# Patient Record
Sex: Female | Born: 1990 | Race: Black or African American | Hispanic: Yes | Marital: Single | Smoking: Current every day smoker
Health system: Southern US, Community
[De-identification: ages and names within clinical notes are randomized; demographics above are authoritative.]

## PROBLEM LIST (undated history)

## (undated) ENCOUNTER — Inpatient Hospital Stay (HOSPITAL_COMMUNITY): Payer: Self-pay

## (undated) DIAGNOSIS — G8929 Other chronic pain: Secondary | ICD-10-CM

## (undated) DIAGNOSIS — B9689 Other specified bacterial agents as the cause of diseases classified elsewhere: Secondary | ICD-10-CM

## (undated) DIAGNOSIS — N760 Acute vaginitis: Secondary | ICD-10-CM

## (undated) DIAGNOSIS — F419 Anxiety disorder, unspecified: Secondary | ICD-10-CM

## (undated) DIAGNOSIS — A599 Trichomoniasis, unspecified: Secondary | ICD-10-CM

## (undated) DIAGNOSIS — M549 Dorsalgia, unspecified: Secondary | ICD-10-CM

## (undated) DIAGNOSIS — G43909 Migraine, unspecified, not intractable, without status migrainosus: Secondary | ICD-10-CM

## (undated) DIAGNOSIS — N883 Incompetence of cervix uteri: Secondary | ICD-10-CM

## (undated) HISTORY — DX: Anxiety disorder, unspecified: F41.9

## (undated) HISTORY — DX: Migraine, unspecified, not intractable, without status migrainosus: G43.909

## (undated) HISTORY — PX: NO PAST SURGERIES: SHX2092

---

## 2011-03-21 ENCOUNTER — Emergency Department (HOSPITAL_COMMUNITY)
Admission: EM | Admit: 2011-03-21 | Discharge: 2011-03-21 | Disposition: A | Discharge status: Home / Self Care | Payer: Self-pay | Attending: Emergency Medicine | Admitting: Emergency Medicine

## 2011-03-21 ENCOUNTER — Encounter (HOSPITAL_COMMUNITY): Payer: Self-pay | Admitting: *Deleted

## 2011-03-21 DIAGNOSIS — R51 Headache: Secondary | ICD-10-CM | POA: Insufficient documentation

## 2011-03-21 DIAGNOSIS — J3489 Other specified disorders of nose and nasal sinuses: Secondary | ICD-10-CM | POA: Insufficient documentation

## 2011-03-21 DIAGNOSIS — IMO0001 Reserved for inherently not codable concepts without codable children: Secondary | ICD-10-CM | POA: Insufficient documentation

## 2011-03-21 DIAGNOSIS — R112 Nausea with vomiting, unspecified: Secondary | ICD-10-CM | POA: Insufficient documentation

## 2011-03-21 DIAGNOSIS — R1084 Generalized abdominal pain: Secondary | ICD-10-CM | POA: Insufficient documentation

## 2011-03-21 DIAGNOSIS — B349 Viral infection, unspecified: Secondary | ICD-10-CM

## 2011-03-21 DIAGNOSIS — B9789 Other viral agents as the cause of diseases classified elsewhere: Secondary | ICD-10-CM | POA: Insufficient documentation

## 2011-03-21 DIAGNOSIS — R070 Pain in throat: Secondary | ICD-10-CM | POA: Insufficient documentation

## 2011-03-21 LAB — URINE MICROSCOPIC-ADD ON

## 2011-03-21 LAB — URINALYSIS, ROUTINE W REFLEX MICROSCOPIC
Bilirubin Urine: NEGATIVE
Ketones, ur: NEGATIVE mg/dL
Nitrite: NEGATIVE
Urobilinogen, UA: 1 mg/dL (ref 0.0–1.0)

## 2011-03-21 MED ORDER — IBUPROFEN 200 MG PO TABS
600.0000 mg | ORAL_TABLET | Freq: Once | ORAL | Status: AC
  Administered 2011-03-21: 600 mg via ORAL
  Filled 2011-03-21: qty 3

## 2011-03-21 MED ORDER — IBUPROFEN 600 MG PO TABS: 600.0000 mg | ORAL_TABLET | Freq: Four times a day (QID) | ORAL | Status: AC | PRN

## 2011-03-21 MED ORDER — ONDANSETRON HCL 4 MG PO TABS: 4.0000 mg | ORAL_TABLET | Freq: Four times a day (QID) | ORAL | Status: AC

## 2011-03-21 MED ORDER — ONDANSETRON 4 MG PO TBDP
4.0000 mg | ORAL_TABLET | Freq: Once | ORAL | Status: AC
  Administered 2011-03-21: 4 mg via ORAL
  Filled 2011-03-21: qty 1
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
## 2011-03-21 NOTE — ED Notes (Signed)
Patient states onset Sunday nausea and vomiting intermittent with generalized abdominal pain 10/10 achy abdominal soft non distended bowel sounds present in all fields.  Airway intact bilateral equal chest rise and fall. Denies urinary complaints.  Ax4 friend at bedside.

## 2011-03-21 NOTE — ED Notes (Signed)
Reports n/v x 2 days with abd pain and cold symptoms. Denies diarrhea. No distress noted at triage.

## 2011-03-21 NOTE — ED Provider Notes (Signed)
History     CSN: [id number]  Arrival date & time 03/21/11  1439   First MD Initiated Contact with Patient 03/21/11 1521      Chief Complaint  Patient presents with  . Emesis  . Abdominal Pain    (Consider location/radiation/quality/duration/timing/severity/associated sxs/prior treatment) Patient is a 21 y.o. female presenting with vomiting, abdominal pain, and URI. The history is provided by the patient.  Emesis  This is a new problem. Episode onset: a few times daily for last 3 days. The problem occurs 2 to 4 times per day. The problem has not changed since onset.The emesis has an appearance of stomach contents (No blood). There has been no fever. Associated symptoms include abdominal pain (mild generalized cramping), headaches, myalgias (very mild this morning but resolved now) and URI. Pertinent negatives include no chills, no cough, no diarrhea and no fever. Associated symptoms comments: Having normal bowel movements (last one yesterday).  .  Abdominal Pain The primary symptoms of the illness include abdominal pain (mild generalized cramping), nausea and vomiting. The primary symptoms of the illness do not include fever, fatigue, shortness of breath, diarrhea, dysuria, vaginal discharge or vaginal bleeding.  Symptoms associated with the illness do not include chills, constipation, urgency, hematuria, frequency or back pain.  URI The primary symptoms include headaches, sore throat, abdominal pain (mild generalized cramping), nausea, vomiting and myalgias (very mild this morning but resolved now). Primary symptoms do not include fever, fatigue, ear pain, swollen glands, cough, wheezing or rash. Episode onset: about 4 days ago. This is a new problem. The problem has not changed since onset. The headache is not associated with photophobia, neck stiffness or weakness.  The myalgias are not associated with weakness.  Associated with: No recent travel. Possible sick contacts at school.  Symptoms associated with the illness include congestion and rhinorrhea. The illness is not associated with chills, facial pain or sinus pressure. The following treatments were addressed: Acetaminophen was effective. Risk factors: None.    No past medical history on file.  No past surgical history on file.  No family history on file.  History  Substance Use Topics  . Smoking status: Not on file  . Smokeless tobacco: Not on file  . Alcohol Use: No    OB History    Grav Para Term Preterm Abortions TAB SAB Ect Mult Living                  Review of Systems  Constitutional: Negative for fever, chills, activity change, appetite change and fatigue.  HENT: Positive for congestion, sore throat and rhinorrhea. Negative for ear pain, neck pain, neck stiffness and sinus pressure.   Eyes: Negative for photophobia, redness and visual disturbance.  Respiratory: Negative for cough, shortness of breath and wheezing.   Cardiovascular: Negative for chest pain, palpitations and leg swelling.  Gastrointestinal: Positive for nausea, vomiting and abdominal pain (mild generalized cramping). Negative for diarrhea, constipation and blood in stool.  Genitourinary: Negative for dysuria, urgency, frequency, hematuria, flank pain, vaginal bleeding, vaginal discharge, vaginal pain and pelvic pain.  Musculoskeletal: Positive for myalgias (very mild this morning but resolved now). Negative for back pain.  Skin: Negative for rash and wound.  Neurological: Positive for headaches. Negative for dizziness, seizures, facial asymmetry, speech difficulty, weakness, light-headedness and numbness.  Psychiatric/Behavioral: Negative for confusion.  All other systems reviewed and are negative.    Allergies  Review of patient's allergies indicates no known allergies.  Home Medications   Current Outpatient Rx  Name Route Sig Dispense Refill  . ACETAMINOPHEN 325 MG PO TABS Oral Take 650 mg by mouth every 6 (six) hours  as needed. pain      BP 114/61  Pulse 80  Temp(Src) 98.4 F (36.9 C) (Oral)  Resp 18  SpO2 100%  LMP 01/30/2011  Physical Exam  Nursing note and vitals reviewed. Constitutional: She is oriented to person, place, and time. She appears well-developed and well-nourished.  Non-toxic appearance. No distress.  HENT:  Head: Normocephalic and atraumatic.  Nose: Rhinorrhea present.  Mouth/Throat: Oropharynx is clear and moist. Mucous membranes are not dry. No posterior oropharyngeal erythema.  Eyes: Conjunctivae and EOM are normal. Pupils are equal, round, and reactive to light. No scleral icterus.  Neck: Normal range of motion. Neck supple. No JVD present.  Cardiovascular: Normal rate, regular rhythm, normal heart sounds and intact distal pulses.   No murmur heard. Pulmonary/Chest: Effort normal and breath sounds normal. No respiratory distress. She has no wheezes. She has no rales.  Abdominal: Soft. Bowel sounds are normal. She exhibits no distension. There is no tenderness. There is no rebound and no guarding.  Musculoskeletal: Normal range of motion.  Neurological: She is alert and oriented to person, place, and time. She has normal strength and normal reflexes. No cranial nerve deficit or sensory deficit. GCS eye subscore is 4. GCS verbal subscore is 5. GCS motor subscore is 6.  Skin: Skin is warm and dry. No rash noted. She is not diaphoretic.  Psychiatric: She has a normal mood and affect.    ED Course  Procedures (including critical care time)  Labs Reviewed  URINALYSIS, ROUTINE W REFLEX MICROSCOPIC - Abnormal; Notable for the following:    APPearance HAZY (*)    Hgb urine dipstick TRACE (*)    All other components within normal limits  URINE MICROSCOPIC-ADD ON - Abnormal; Notable for the following:    Squamous Epithelial / LPF MANY (*)    Bacteria, UA FEW (*)    All other components within normal limits  POCT PREGNANCY, URINE   No results found.   1. Viral syndrome        MDM  20yo AAF with no significant PMH who presents to the ED due to nasal congestion, headache, sore throat, vomiting and mild abdominal discomfort diffusely. Onset 3-4d ago. Symptoms consistent with flu-like viral illness. Pt does not appear to be dehydrated. Abdominal exam benign without any tenderness to palpation. Pt afebrile and well appearing on exam. No missed periods. UPT neg. UA ordered by RN pt w/o urinary sx few bact on UA but also epethelials present will not tx. No CVA tenderness. Will d/c on motrin and zofran.         Verne Carrow, MD 03/21/11 [phone]

## 2011-03-21 NOTE — ED Notes (Signed)
NAD noted at d/c. Pt verbalized understanding of d/c inst. 

## 2011-03-22 NOTE — ED Provider Notes (Signed)
I saw and evaluated the patient, reviewed the resident's note and I agree with the findings and plan.  Nicholes Stairs, MD 03/22/11 [phone]

## 2012-03-27 ENCOUNTER — Encounter (HOSPITAL_COMMUNITY): Payer: Self-pay | Admitting: *Deleted

## 2012-03-27 ENCOUNTER — Emergency Department (HOSPITAL_COMMUNITY)
Admission: EM | Admit: 2012-03-27 | Discharge: 2012-03-27 | Disposition: A | Discharge status: Home / Self Care | Payer: Self-pay | Attending: Emergency Medicine | Admitting: Emergency Medicine

## 2012-03-27 DIAGNOSIS — S20219A Contusion of unspecified front wall of thorax, initial encounter: Secondary | ICD-10-CM | POA: Insufficient documentation

## 2012-03-27 DIAGNOSIS — R58 Hemorrhage, not elsewhere classified: Secondary | ICD-10-CM

## 2012-03-27 DIAGNOSIS — Y929 Unspecified place or not applicable: Secondary | ICD-10-CM | POA: Insufficient documentation

## 2012-03-27 DIAGNOSIS — Y939 Activity, unspecified: Secondary | ICD-10-CM | POA: Insufficient documentation

## 2012-03-27 DIAGNOSIS — X58XXXA Exposure to other specified factors, initial encounter: Secondary | ICD-10-CM | POA: Insufficient documentation

## 2012-03-27 DIAGNOSIS — M549 Dorsalgia, unspecified: Secondary | ICD-10-CM | POA: Insufficient documentation

## 2012-03-27 DIAGNOSIS — G8929 Other chronic pain: Secondary | ICD-10-CM | POA: Insufficient documentation

## 2012-03-27 HISTORY — DX: Other chronic pain: G89.29

## 2012-03-27 HISTORY — DX: Dorsalgia, unspecified: M54.9

## 2012-03-27 LAB — BASIC METABOLIC PANEL
CO2: 25 mEq/L (ref 19–32)
Chloride: 104 mEq/L (ref 96–112)
Creatinine, Ser: 0.66 mg/dL (ref 0.50–1.10)
Potassium: 4.4 mEq/L (ref 3.5–5.1)

## 2012-03-27 LAB — CBC WITH DIFFERENTIAL/PLATELET
Basophils Absolute: 0 10*3/uL (ref 0.0–0.1)
HCT: 40.6 % (ref 36.0–46.0)
Hemoglobin: 13.1 g/dL (ref 12.0–15.0)
Lymphocytes Relative: 19 % (ref 12–46)
Monocytes Absolute: 0.7 10*3/uL (ref 0.1–1.0)
Neutro Abs: 8.6 10*3/uL — ABNORMAL HIGH (ref 1.7–7.7)
Neutrophils Relative %: 72 % (ref 43–77)
RDW: 14.1 % (ref 11.5–15.5)
WBC: 12 10*3/uL — ABNORMAL HIGH (ref 4.0–10.5)

## 2012-03-27 NOTE — ED Notes (Signed)
Pt noticed yellow bruising to chest last night.  When she woke up this am, they were "yellower".  Denies injury or physical contact with another.  States she often sleeps with her hands up on her chest.  Also states she frequently awakes with bruises.

## 2012-03-27 NOTE — ED Notes (Signed)
Pt has "thumb print" sized bruises on upper chest. They appear to be yellow bruises. Pt denies trauma. Pt denies tenderness.

## 2012-03-27 NOTE — ED Provider Notes (Signed)
Medical screening examination/treatment/procedure(s) were performed by non-physician practitioner and as supervising physician I was immediately available for consultation/collaboration.  Dennice Tindol R. Charan Prieto, MD 03/27/12 2345 
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
## 2013-01-19 ENCOUNTER — Encounter: Payer: Self-pay | Admitting: Obstetrics & Gynecology

## 2013-04-27 ENCOUNTER — Encounter: Payer: Self-pay | Admitting: Obstetrics & Gynecology

## 2013-10-28 ENCOUNTER — Emergency Department (HOSPITAL_COMMUNITY)
Admission: EM | Admit: 2013-10-28 | Discharge: 2013-10-28 | Disposition: A | Discharge status: Home / Self Care | Payer: Self-pay | Source: Home / Self Care

## 2013-10-28 ENCOUNTER — Encounter (HOSPITAL_COMMUNITY): Payer: Self-pay | Admitting: Emergency Medicine

## 2013-10-28 DIAGNOSIS — N12 Tubulo-interstitial nephritis, not specified as acute or chronic: Secondary | ICD-10-CM

## 2013-10-28 LAB — POCT URINALYSIS DIP (DEVICE)
Bilirubin Urine: NEGATIVE
Glucose, UA: NEGATIVE mg/dL
Ketones, ur: NEGATIVE mg/dL
NITRITE: POSITIVE — AB
PH: 6 (ref 5.0–8.0)
Specific Gravity, Urine: 1.02 (ref 1.005–1.030)
Urobilinogen, UA: 1 mg/dL (ref 0.0–1.0)

## 2013-10-28 LAB — POCT PREGNANCY, URINE: PREG TEST UR: NEGATIVE

## 2013-10-28 MED ORDER — ACETAMINOPHEN 500 MG PO TABS
1000.0000 mg | ORAL_TABLET | Freq: Once | ORAL | Status: AC
  Administered 2013-10-28: 1000 mg via ORAL

## 2013-10-28 MED ORDER — SODIUM CHLORIDE 0.9 % IV SOLN
Freq: Once | INTRAVENOUS | Status: AC
  Administered 2013-10-28: 18:00:00 via INTRAVENOUS

## 2013-10-28 MED ORDER — CEFTRIAXONE SODIUM 1 G IJ SOLR
INTRAMUSCULAR | Status: AC
  Filled 2013-10-28: qty 10

## 2013-10-28 MED ORDER — ACETAMINOPHEN 325 MG PO TABS
ORAL_TABLET | ORAL | Status: AC
  Filled 2013-10-28: qty 3

## 2013-10-28 MED ORDER — ONDANSETRON HCL 4 MG/2ML IJ SOLN
INTRAMUSCULAR | Status: AC
  Filled 2013-10-28: qty 2

## 2013-10-28 MED ORDER — LIDOCAINE HCL (PF) 1 % IJ SOLN
INTRAMUSCULAR | Status: AC
  Filled 2013-10-28: qty 5

## 2013-10-28 MED ORDER — ONDANSETRON HCL 4 MG PO TABS: 4.0000 mg | ORAL_TABLET | Freq: Four times a day (QID) | ORAL | Status: DC

## 2013-10-28 MED ORDER — CIPROFLOXACIN HCL 500 MG PO TABS: 500.0000 mg | ORAL_TABLET | Freq: Two times a day (BID) | ORAL | Status: DC

## 2013-10-28 MED ORDER — CEFTRIAXONE SODIUM 1 G IJ SOLR
1.0000 g | Freq: Once | INTRAMUSCULAR | Status: AC
  Administered 2013-10-28: 1 g via INTRAMUSCULAR

## 2013-10-28 MED ORDER — ONDANSETRON HCL 4 MG/2ML IJ SOLN
4.0000 mg | Freq: Once | INTRAMUSCULAR | Status: AC
  Administered 2013-10-28: 4 mg via INTRAVENOUS

## 2013-10-28 NOTE — ED Provider Notes (Signed)
Medical screening examination/treatment/procedure(s) were performed by resident physician or non-physician practitioner and as supervising physician I was immediately available for consultation/collaboration.   Keniyah Gelinas DOUGLAS MD.   Galileo Colello D Rashika Bettes, MD 10/28/13 2000 
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
## 2013-10-28 NOTE — ED Notes (Signed)
C/o urinary frequency.  Chills.  Fever.  Sob.  Weak/fatigue. Mild back pain. N/v/d.   Symptoms present since 9/3.

## 2013-10-28 NOTE — Discharge Instructions (Signed)
Pyelonephritis, Adult °Pyelonephritis is a kidney infection. In general, there are 2 main types of pyelonephritis: °· Infections that come on quickly without any warning (acute pyelonephritis). °· Infections that persist for a long period of time (chronic pyelonephritis). °CAUSES  °Two main causes of pyelonephritis are: °· Bacteria traveling from the bladder to the kidney. This is a problem especially in pregnant women. The urine in the bladder can become filled with bacteria from multiple causes, including: °¨ Inflammation of the prostate gland (prostatitis). °¨ Sexual intercourse in females. °¨ Bladder infection (cystitis). °· Bacteria traveling from the bloodstream to the tissue part of the kidney. °Problems that may increase your risk of getting a kidney infection include: °· Diabetes. °· Kidney stones or bladder stones. °· Cancer. °· Catheters placed in the bladder. °· Other abnormalities of the kidney or ureter. °SYMPTOMS  °· Abdominal pain. °· Pain in the side or flank area. °· Fever. °· Chills. °· Upset stomach. °· Blood in the urine (dark urine). °· Frequent urination. °· Strong or persistent urge to urinate. °· Burning or stinging when urinating. °DIAGNOSIS  °Your caregiver may diagnose your kidney infection based on your symptoms. A urine sample may also be taken. °TREATMENT  °In general, treatment depends on how severe the infection is.  °· If the infection is mild and caught early, your caregiver may treat you with oral antibiotics and send you home. °· If the infection is more severe, the bacteria may have gotten into the bloodstream. This will require intravenous (IV) antibiotics and a hospital stay. Symptoms may include: °¨ High fever. °¨ Severe flank pain. °¨ Shaking chills. °· Even after a hospital stay, your caregiver may require you to be on oral antibiotics for a period of time. °· Other treatments may be required depending upon the cause of the infection. °HOME CARE INSTRUCTIONS  °· Take your  antibiotics as directed. Finish them even if you start to feel better. °· Make an appointment to have your urine checked to make sure the infection is gone. °· Drink enough fluids to keep your urine clear or pale yellow. °· Take medicines for the bladder if you have urgency and frequency of urination as directed by your caregiver. °SEEK IMMEDIATE MEDICAL CARE IF:  °· You have a fever or persistent symptoms for more than 2-3 days. °· You have a fever and your symptoms suddenly get worse. °· You are unable to take your antibiotics or fluids. °· You develop shaking chills. °· You experience extreme weakness or fainting. °· There is no improvement after 2 days of treatment. °MAKE SURE YOU: °· Understand these instructions. °· Will watch your condition. °· Will get help right away if you are not doing well or get worse. °Document Released: 02/05/2005 Document Revised: 08/07/2011 Document Reviewed: 07/12/2010 °[employer]® Patient Information ©2015 [employer]. This information is not intended to replace advice given to you by your health care provider. Make sure you discuss any questions you have with your health care provider. ° °

## 2013-10-30 LAB — URINE CULTURE: Colony Count: >=100000

## 2013-11-01 NOTE — ED Notes (Signed)
Urine culture: >100,000 colonies E. Coli  Pt. adequately treated with Cipro. Vassie Moselle 11/01/2013

## 2013-11-30 ENCOUNTER — Ambulatory Visit: Payer: Self-pay

## 2015-02-20 NOTE — L&D Delivery Note (Signed)
Delivery Note At 9:38 AM a viable female was delivered via  (direct OP  ).  APGAR: 9 ,9 ; weight  pending.   Placenta status: complete, intact , . 3V Cord:   Anesthesia:  None Episiotomy:  None Lacerations:  1st degree, good hemostasis. No repair Est. Blood Loss (mL):  200  [name].  Baby to Couplet care / Skin to Skin.  [name] 10/15/2015, 10:01 AM  OB FELLOW DELIVERY ATTESTATION  I was gloved and present for the delivery in its entirety, and I agree with the above resident's note.    Ernestina PennaNicholas Pratik Dalziel, MD 11:47 AM

## 2015-03-08 ENCOUNTER — Encounter (HOSPITAL_COMMUNITY): Payer: Self-pay | Admitting: *Deleted

## 2015-03-08 ENCOUNTER — Inpatient Hospital Stay (HOSPITAL_COMMUNITY)
Admission: AD | Admit: 2015-03-08 | Discharge: 2015-03-08 | Disposition: A | Discharge status: Home / Self Care | Payer: Self-pay | Source: Ambulatory Visit | Attending: Family Medicine | Admitting: Family Medicine

## 2015-03-08 DIAGNOSIS — G8929 Other chronic pain: Secondary | ICD-10-CM | POA: Insufficient documentation

## 2015-03-08 DIAGNOSIS — Z3201 Encounter for pregnancy test, result positive: Secondary | ICD-10-CM | POA: Insufficient documentation

## 2015-03-08 DIAGNOSIS — Z3A08 8 weeks gestation of pregnancy: Secondary | ICD-10-CM | POA: Insufficient documentation

## 2015-03-08 LAB — URINALYSIS, ROUTINE W REFLEX MICROSCOPIC
Bilirubin Urine: NEGATIVE
Glucose, UA: NEGATIVE mg/dL
Ketones, ur: NEGATIVE mg/dL
Nitrite: NEGATIVE
Protein, ur: NEGATIVE mg/dL
Specific Gravity, Urine: 1.025 (ref 1.005–1.030)
pH: 6 (ref 5.0–8.0)

## 2015-03-08 LAB — URINE MICROSCOPIC-ADD ON: RBC / HPF: NONE SEEN RBC/hpf (ref 0–5)

## 2015-03-08 LAB — POCT PREGNANCY, URINE: Preg Test, Ur: POSITIVE — AB

## 2015-03-08 NOTE — Discharge Instructions (Signed)
Prenatal Care [facility] OB/GYN    [facility] OB/GYN  & Infertility  [phone]     Phone: [phone]          [facility]                      [facility]   [place]     Phone: [phone]  Phone: [phone]         [facility]     Phone: [phone]  Phone: [phone]           [facility] @ [place]                hone: [phone]  Phone: [phone]         [facility] [address] Francoise Ceo      Phone: [phone]  Phone: [phone]         [facility]                Phone: [phone]  [facility]   [phone]    Family [address] [place])          Phone: [phone] [facility]   Phone: [phone] Medications in Pregnancy   Acne: Benzoyl Peroxide Salicylic Acid  Backache/Headache: Tylenol: 2 regular strength every 4 hours OR              2 Extra strength every 6 hours  Colds/Coughs/Allergies: Benadryl (alcohol free) 25 mg every 6 hours as needed Breath right strips Claritin Cepacol throat lozenges Chloraseptic throat spray Cold-Eeze- up to three times per day Cough drops, alcohol free Flonase (by prescription only) Guaifenesin Mucinex Robitussin DM (plain only, alcohol free) Saline nasal spray/drops Sudafed (pseudoephedrine) & Actifed ** use only after [redacted] weeks gestation and if you do not have high blood pressure Tylenol Vicks Vaporub Zinc lozenges Zyrtec   Constipation: Colace Ducolax suppositories Fleet enema Glycerin suppositories Metamucil Milk of magnesia Miralax Senokot Smooth move tea  Diarrhea: Kaopectate Imodium A-D  *NO pepto Bismol  Hemorrhoids: Anusol Anusol HC Preparation H Tucks  Indigestion: Tums Maalox Mylanta Zantac  Pepcid  Insomnia: Benadryl (alcohol free)  every 6 hours as needed Tylenol PM Unisom,  no Gelcaps  Leg Cramps: Tums MagGel  Nausea/Vomiting:  Bonine Dramamine Emetrol Ginger extract Sea bands Meclizine  Nausea medication to take during pregnancy:  Unisom (doxylamine succinate 25 mg tablets) Take one tablet daily at bedtime. If symptoms are not adequately controlled, the dose can be increased to a maximum recommended dose of two tablets daily (1/2 tablet in the morning, 1/2 tablet mid-afternoon and one at bedtime). Vitamin B6  tablets. Take one tablet twice a day (up to 200 mg per day).  Skin Rashes: Aveeno products Benadryl cream or  every 6 hours as needed Calamine Lotion 1% cortisone cream  Yeast infection: Gyne-lotrimin 7 Monistat 7   **If taking multiple medications, please check labels to avoid duplicating the same active ingredients **take medication as directed on the label ** Do not exceed 4000 mg of tylenol in 24 hours **Do not take medications that contain aspirin or ibuprofen   Prenatal Vitamin and Mineral Combinations (oral solid dosage forms) What is this medicine? PRENATAL VITAMIN AND MINERAL combinations are used before, during, and after pregnancy to help provide provide good nutrition. This medicine may be used for other purposes; ask your health care provider or  pharmacist if you have questions. What should I tell my health care provider before I take this medicine? They need to know if you have any of these conditions: -bleeding or clotting disorder -history of anemia of any type -other chronic health condition -an unusual or allergic reaction to vitamins, minerals, other medicines, foods, dyes, or preservatives How should I use this medicine? Take this medicine by mouth with a glass of water. You can take it with or without food. If it upsets your stomach, take it with food. Chewable prenatal vitamin tablets may be chewed completely before swallowing. Follow the directions on the prescription label. The usual dose is taken once  a day. Do not take your medicine more often than directed. Contact your pediatrician regarding the use of this medicine in children. Special care may be needed. This medicine is intended for females who are pregnant, breast-feeding, or may become pregnant. Overdosage: If you think you have taken too much of this medicine contact a poison control center or emergency room at once. NOTE: This medicine is only for you. Do not share this medicine with others. What if I miss a dose? If you miss a dose, take it as soon as you can. If it is almost time for your next dose, take only that dose. Do not take double or extra doses. What may interact with this medicine? -alendronate -antacids -cefdinir -cefditoren -etidronate -fluoroquinolone antibiotics (examples: ciprofloxacin, gatifloxacin, levofloxacin) -ibandronate -levodopa -risedronate -tetracycline antibiotics (examples: doxycycline, minocycline, tetracycline) -thyroid hormones -warfarin This list may not describe all possible interactions. Give your health care provider a list of all the medicines, herbs, non-prescription drugs, or dietary supplements you use. Also tell them if you smoke, drink alcohol, or use illegal drugs. Some items may interact with your medicine. What should I watch for while using this medicine? See your health care professional for regular checks on your progress. Remember that vitamin and mineral supplements do not replace the need for good nutrition from a balanced diet. Stools commonly change color when vitamins and minerals are taken. Notify your health care professional if this change is alarming or accompanied by other symptoms, like abdominal pain. What side effects may I notice from receiving this medicine? Side effects that you should report to your doctor or health care professional as soon as possible: -allergic reaction such as skin rash or difficulty breathing -vomiting Side effects that usually do not  require medical attention (report to your doctor or health care professional if they continue or are bothersome): -nausea -stomach upset This list may not describe all possible side effects. Call your doctor for medical advice about side effects. You may report side effects to FDA at 1-800-FDA-1088. Where should I keep my medicine? Keep out of the reach of children. Most vitamins and minerals should be stored at controlled room temperature. Check your specific product directions. Protect from heat and moisture. Throw away any unused medicine after the expiration date. NOTE: This sheet is a summary. It may not cover all possible information. If you have questions about this medicine, talk to your doctor, pharmacist, or health care provider.    2016, Elsevier/Gold Standard. (2014-09-02 09:02:38)

## 2015-03-08 NOTE — MAU Provider Note (Signed)
This patient presents with request of pregnancy verification. She denies pain or bleeding. Her pregnancy test here today is positive and I will give her the verification letter to take to the health department so she can start her prenatal care. She is to start prenatal vitamins. She will return here as needed for any problems. FHT's obtained S:  Morgan Ramirez is a 25 y.o. G1P0 at [redacted]w[redacted]d wks here for confirmation of pregnancy.  Patient's Patient's last menstrual period was 01/11/2015.Marland Kitchen  Denies any vaginal bleeding or abdominal pain.  Plans to get prenatal care at undecidied.  O:  Past Medical History  Diagnosis Date  . Chronic back pain     No family history on file.   Filed Vitals:   03/08/15 0938  BP: 115/68  Pulse: 73  Temp: 98.3 F (36.8 C)  Resp: 18   General:  A&OX3 with no signs of acute distress. She appears well-developed and well-nourished. No distress.  Neck: Normal range of motion.  Pulmonary/Chest: Effort normal. No respiratory distress.  Musculoskeletal: Normal range of motion.  Neurological: She is alert and oriented to person, place, and time.  Skin: Skin is warm and dry.   A: Positive Pregnancy Test  P: Explained benefits of taking prenatal vitamins w/folic acid early in pregnancy. Begin prenatal care. Reviewed warning signs of pregnancy.  Rhea Pink, CNM

## 2015-03-08 NOTE — MAU Note (Signed)
Pos HPT two weeks ago, wants to know how far along she is.  Has had lower abdominal pain recently but not today, denies bleeding.  No appetite.

## 2015-07-09 ENCOUNTER — Encounter (HOSPITAL_COMMUNITY): Payer: Self-pay | Admitting: *Deleted

## 2015-07-09 ENCOUNTER — Inpatient Hospital Stay (HOSPITAL_COMMUNITY)
Admission: AD | Admit: 2015-07-09 | Discharge: 2015-07-09 | Disposition: A | Discharge status: Home / Self Care | Payer: Medicaid Other | Source: Ambulatory Visit | Attending: Obstetrics and Gynecology | Admitting: Obstetrics and Gynecology

## 2015-07-09 DIAGNOSIS — Z3A25 25 weeks gestation of pregnancy: Secondary | ICD-10-CM | POA: Insufficient documentation

## 2015-07-09 DIAGNOSIS — A5901 Trichomonal vulvovaginitis: Secondary | ICD-10-CM | POA: Diagnosis not present

## 2015-07-09 DIAGNOSIS — O4692 Antepartum hemorrhage, unspecified, second trimester: Secondary | ICD-10-CM | POA: Diagnosis present

## 2015-07-09 DIAGNOSIS — O98312 Other infections with a predominantly sexual mode of transmission complicating pregnancy, second trimester: Secondary | ICD-10-CM | POA: Insufficient documentation

## 2015-07-09 LAB — URINALYSIS, ROUTINE W REFLEX MICROSCOPIC
Bilirubin Urine: NEGATIVE
Glucose, UA: NEGATIVE mg/dL
Ketones, ur: NEGATIVE mg/dL
NITRITE: NEGATIVE
PROTEIN: NEGATIVE mg/dL
pH: 6 (ref 5.0–8.0)

## 2015-07-09 LAB — URINE MICROSCOPIC-ADD ON: RBC / HPF: NONE SEEN RBC/hpf (ref 0–5)

## 2015-07-09 LAB — WET PREP, GENITAL
Sperm: NONE SEEN
Yeast Wet Prep HPF POC: NONE SEEN

## 2015-07-09 MED ORDER — METRONIDAZOLE 500 MG PO TABS
2000.0000 mg | ORAL_TABLET | Freq: Once | ORAL | Status: AC
  Administered 2015-07-09: 2000 mg via ORAL
  Filled 2015-07-09: qty 4

## 2015-07-09 NOTE — MAU Provider Note (Signed)
  History     CSN: [id number]  Arrival date and time: 07/09/15 0130   None    Chief Complaint  Patient presents with  . Vaginal Bleeding  . Abdominal Pain   HPI Mrs. Jimmye NormanFarmer is a 25yo G1 female @ 25.4wks by LMP presenting today for vaginal bleeding and abdominal pain. No history of prenatal care. Bleeding present intermittently since 5/16. States she was accidentally hit in the abdomen at work on 5/17. Notes small amount of brown blood in her underwear and after urination. Also reports white/yellow discharge for several weeks. Abdominal pain started tonight and happened twice about 10 minutes apart. Both times she felt the urge to push. No pain currently and denies any further pain since those two episodes.  OB History    Gravida Para Term Preterm AB TAB SAB Ectopic Multiple Living   1               Past Medical History  Diagnosis Date  . Chronic back pain     Past Surgical History  Procedure Laterality Date  . No past surgeries      No family history on file.  Social History  Substance Use Topics  . Smoking status: Never Smoker   . Smokeless tobacco: None  . Alcohol Use: No    Allergies: No Known Allergies  No prescriptions prior to admission    ROS Physical Exam   Blood pressure 114/71, pulse 95, resp. rate 18, last menstrual period 01/11/2015.  Physical Exam  Constitutional: She appears well-developed and well-nourished. No distress.  Cardiovascular: Normal rate and regular rhythm.  Exam reveals no gallop and no friction rub.   No murmur heard. Respiratory: Effort normal. No respiratory distress. She has no wheezes.  Genitourinary:  White vaginal discharge noted.  Psychiatric: She has a normal mood and affect. Her behavior is normal.   Urinalysis    Component Value Date/Time   COLORURINE YELLOW 07/09/2015 0135   APPEARANCEUR CLEAR 07/09/2015 0135   LABSPEC <1.005* 07/09/2015 0135   PHURINE 6.0 07/09/2015 0135   GLUCOSEU NEGATIVE 07/09/2015 0135   HGBUR TRACE* 07/09/2015 0135   BILIRUBINUR NEGATIVE 07/09/2015 0135   KETONESUR NEGATIVE 07/09/2015 0135   PROTEINUR NEGATIVE 07/09/2015 0135   UROBILINOGEN 1.0 10/28/2013 1751   NITRITE NEGATIVE 07/09/2015 0135   LEUKOCYTESUR MODERATE* 07/09/2015 0135   Micro: 0-5 SE, rare bacteria, +trich  MAU Course  Procedures  MDM Urinalysis with trichomonas. Wet prep and GC/Chlamydia obtained. Metronidazole 2000mg  given prior to discharge. Fetal monitor with 140bpm, moderate variability.  Assessment and Plan  Trichomoniasis: Treated with Metronidazole prior to discharge.  GC/Chlamydia pending. Discussed safe sex practices and encouraged treatment of partner. Encouraged follow up with Ob/Gyn to establish prenatal care. Pregnancy Verification letter given.   [facility] System[facility]aleigh Rumley 07/09/2015, 2:31 AM   I have participated in the care of this patient and I agree with the above. Wet prep: +trich, +clue, many WBC. Cam HaiSHAW, KIMBERLY CNM 8:51 AM 07/09/2015

## 2015-07-09 NOTE — Discharge Instructions (Signed)
[facility] [place] [facility]    [facility]  [phone]     Phone: [phone]          [facility]                      [facility]  @[place]     Phone: [phone]  Phone: [phone]         [facility]     Phone: [phone]  Phone: [phone]           [facility] @ [place]                hone: [phone]  Phone: [phone]         [facility] [address] Francoise CeoBernard Marshall      Phone: [phone]  Phone: [phone]         [facility]                Phone: [phone]  [facility]   [address][phone]    [facility] [place])          Phone: [phone] [facility]   Phone: [phone]   ________________________________________     To schedule your Maternity Eligibility Appointment, please call [phone].  When you arrive for your appointment you must bring the following items or information listed below.  Your appointment will be rescheduled if you do not have these items or are 15 minutes late. If currently receiving Medicaid, you MUST bring: 1. Medicaid Card 2. Social Security Card 3. Picture ID 4. Proof of Pregnancy 5. Verification of current address if the address on Medicaid card is incorrect "postmarked mail" If not receiving Medicaid, you MUST bring: 1. Social Security Card 2. Picture ID 3. Birth Certificate (if available) Passport or *Green Card 4. Proof of Pregnancy 5. Verification of current address "postmarked mail" for each income presented. 6. Verification of insurance coverage, if any 7. Check stubs from each employer for the previous month (if unable to present check stub  for each week, we will accept check stub for the first and last week ill the same month.) If you can't locate check stubs, you must bring a letter from the  employer(s) and it must have the following information on letterhead, typed, in English: o name of company o company telephone number o how long been with the company, if less than one month o how much person earns per hour o how many hours per week work o the gross pay the person earned for the previous month If you are 24 years old or less, you do not have to bring proof of income unless you work or live with the father of the baby and at that time we will need proof of income from you and/or the father of the baby. Green Card recipients are eligible for Medicaid for Pregnant Women (MPW)

## 2015-07-09 NOTE — MAU Note (Signed)
ptn has not had prenatal care aprox [redacted] weeks pregnant. Arrived EMS with c/o vag bleeding off and on x 1 week and abd cramping that stared tonight.

## 2015-07-11 LAB — GC/CHLAMYDIA PROBE AMP (~~LOC~~) NOT AT ARMC
CHLAMYDIA, DNA PROBE: NEGATIVE
Neisseria Gonorrhea: NEGATIVE

## 2015-08-12 ENCOUNTER — Inpatient Hospital Stay (HOSPITAL_COMMUNITY)
Admission: AD | Admit: 2015-08-12 | Discharge: 2015-08-12 | Disposition: A | Discharge status: Home / Self Care | Payer: Medicaid Other | Source: Ambulatory Visit | Attending: Family Medicine | Admitting: Family Medicine

## 2015-08-12 DIAGNOSIS — O0933 Supervision of pregnancy with insufficient antenatal care, third trimester: Secondary | ICD-10-CM | POA: Diagnosis not present

## 2015-08-12 DIAGNOSIS — O26893 Other specified pregnancy related conditions, third trimester: Secondary | ICD-10-CM | POA: Insufficient documentation

## 2015-08-12 DIAGNOSIS — Z3A30 30 weeks gestation of pregnancy: Secondary | ICD-10-CM | POA: Diagnosis not present

## 2015-08-12 NOTE — MAU Provider Note (Signed)
MAU HISTORY AND PHYSICAL  Chief Complaint:  No chief complaint on file.   Morgan Ramirez is a 25 y.o.  G1P0 with IUP at 5828w3d presenting for No chief complaint on file. Pt comes in for more information due to having no prenatal care due to lack of insurance.  Pt is planning on placing pt for adoption and adoptive mother is in room.  Birth plan is to have adoptive mother have skin to skin, pt does not desire to see infant.  They are filling out paperwork.  Otherwise pt states she had her last LMP 01/13/2015 which is reliable.  Has had 2 ED visits were she was told she was [redacted] weeks pregnant but no initial lab work.  Did smoker 1-3 cigarettes prior to pregnancy but none since diagnosis. This is her first pregnany, no prior miscarriages or abortions stated.  Patient states she has been having  none contractions, none vaginal bleeding, intact membranes, with active fetal movement.    Past Medical History  Diagnosis Date  . Chronic back pain     Past Surgical History  Procedure Laterality Date  . No past surgeries      No family history on file.  Social History  Substance Use Topics  . Smoking status: Never Smoker   . Smokeless tobacco: Not on file  . Alcohol Use: No    No Known Allergies  No prescriptions prior to admission    Review of Systems - Negative except for what is mentioned in HPI.  Physical Exam  Blood pressure 126/73, pulse 88, temperature 98.1 F (36.7 C), resp. rate 18, height 5\' 3"  (1.6 m), weight 138 lb 6.4 oz (62.778 kg), last menstrual period 01/11/2015. GENERAL: Well-developed, well-nourished female in no acute distress.  LUNGS: Clear to auscultation bilaterally.  HEART: Regular rate and rhythm. ABDOMEN: Soft, nontender, nondistended, gravid.  EXTREMITIES: Nontender, no edema, 2+ distal pulses. Cervical Exam: Deferred Presentation: cephalic FHT:  Reactive Contractions: None   Labs: No results found for this or any previous visit (from the past 24  hour(s)).  Imaging Studies:  No results found.  Assessment: Morgan Ramirez is  25 y.o. G1P0 at 1728w3d presents with No chief complaint on file. .  Plan: #No prenatal care -Gave information for health department and WOC to make an appointment ASAP. -Discussed labor precautions.  Olena LeatherwoodKelly M Aguilar 6/23/20178:47 PM  I have participated in the care of this patient and I agree with the above. NST: 130s, approp for GA, no decels, no ctx Cam HaiSHAW, Kai Railsback CNM 9:29 PM 08/12/2015

## 2015-08-12 NOTE — Discharge Instructions (Signed)
How a Baby Grows During Pregnancy Pregnancy begins when a female's sperm enters a female's egg (fertilization). This happens in one of the tubes (fallopian tubes) that connect the ovaries to the womb (uterus). The fertilized egg is called an embryo until it reaches 10 weeks. From 10 weeks until birth, it is called a fetus. The fertilized egg moves down the fallopian tube to the uterus. Then it implants into the lining of the uterus and begins to grow. The developing fetus receives oxygen and nutrients through the pregnant woman's bloodstream and the tissues that grow (placenta) to support the fetus. The placenta is the life support system for the fetus. It provides nutrition and removes waste. Learning as much as you can about your pregnancy and how your baby is developing can help you enjoy the experience. It can also make you aware of when there might be a problem and when to ask questions. HOW LONG DOES A TYPICAL PREGNANCY LAST? A pregnancy usually lasts 280 days, or about 40 weeks. Pregnancy is divided into three trimesters:  First trimester: 0-13 weeks.  Second trimester: 14-27 weeks.  Third trimester: 28-40 weeks. The day when your baby is considered ready to be born (full term) is your estimated date of delivery. HOW DOES MY BABY DEVELOP MONTH BY MONTH? First month  The fertilized egg attaches to the inside of the uterus.  Some cells will form the placenta. Others will form the fetus.  The arms, legs, brain, spinal cord, lungs, and heart begin to develop.  At the end of the first month, the heart begins to beat. Second month  The bones, inner ear, eyelids, hands, and feet form.  The genitals develop.  By the end of 8 weeks, all major organs are developing. Third month  All of the internal organs are forming.  Teeth develop below the gums.  Bones and muscles begin to grow. The spine can flex.  The skin is transparent.  Fingernails and toenails begin to form.  Arms and  legs continue to grow longer, and hands and feet develop.  The fetus is about 3 in (7.6 cm) long. Fourth month  The placenta is completely formed.  The external sex organs, neck, outer ear, eyebrows, eyelids, and fingernails are formed.  The fetus can hear, swallow, and move its arms and legs.  The kidneys begin to produce urine.  The skin is covered with a white waxy coating (vernix) and very fine hair (lanugo). Fifth month  The fetus moves around more and can be felt for the first time (quickening).  The fetus starts to sleep and wake up and may begin to suck its finger.  The nails grow to the end of the fingers.  The organ in the digestive system that makes bile (gallbladder) functions and helps to digest the nutrients.  If your baby is a girl, eggs are present in her ovaries. If your baby is a boy, testicles start to move down into his scrotum. Sixth month  The lungs are formed, but the fetus is not yet able to breathe.  The eyes open. The brain continues to develop.  Your baby has fingerprints and toe prints. Your baby's hair grows thicker.  At the end of the second trimester, the fetus is about 9 in (22.9 cm) long. Seventh month  The fetus kicks and stretches.  The eyes are developed enough to sense changes in light.  The hands can make a grasping motion.  The fetus responds to sound. Eighth month  All organs and body systems are fully developed and functioning.  Bones harden and taste buds develop. The fetus may hiccup.  Certain areas of the brain are still developing. The skull remains soft. Ninth month  The fetus gains about  lb (0.23 kg) each week.  The lungs are fully developed.  Patterns of sleep develop.  The fetus's head typically moves into a head-down position (vertex) in the uterus to prepare for birth. If the buttocks move into a vertex position instead, the baby is breech.  The fetus weighs 6-9 lbs (2.72-4.08 kg) and is 19-20 in  (48.26-50.8 cm) long. WHAT CAN I DO TO HAVE A HEALTHY PREGNANCY AND HELP MY BABY DEVELOP? Eating and Drinking  Eat a healthy diet.  Talk with your health care provider to make sure that you are getting the nutrients that you and your baby need.  Visit www.[employer].be to learn about creating a healthy diet.  Gain a healthy amount of weight during pregnancy as advised by your health care provider. This is usually 25-35 pounds. You may need to:  Gain more if you were underweight before getting pregnant or if you are pregnant with more than one baby.  Gain less if you were overweight or obese when you got pregnant. Medicines and Vitamins  Take prenatal vitamins as directed by your health care provider. These include vitamins such as folic acid, iron, calcium, and vitamin D. They are important for healthy development.  Take medicines only as directed by your health care provider. Read labels and ask a pharmacist or your health care provider whether over-the-counter medicines, supplements, and prescription drugs are safe to take during pregnancy. Activities  Be physically active as advised by your health care provider. Ask your health care provider to recommend activities that are safe for you to do, such as walking or swimming.  Do not participate in strenuous or extreme sports. Lifestyle  Do not drink alcohol.  Do not use any tobacco products, including cigarettes, chewing tobacco, or electronic cigarettes. If you need help quitting, ask your health care provider.  Do not use illegal drugs. Safety  Avoid exposure to mercury, lead, or other heavy metals. Ask your health care provider about common sources of these heavy metals.  Avoid listeria infection during pregnancy. Follow these precautions:  Do not eat soft cheeses or deli meats.  Do not eat hot dogs unless they have been warmed up to the point of steaming, such as in the microwave oven.  Do not drink unpasteurized  milk.  Avoid toxoplasmosis infection during pregnancy. Follow these precautions:  Do not change your cat's litter box, if you have a cat. Ask someone else to do this for you.  Wear gardening gloves while working in the yard. General Instructions  Keep all follow-up visits as directed by your health care provider. This is important. This includes prenatal care and screening tests.  Manage any chronic health conditions. Work closely with your health care provider to keep conditions, such as diabetes, under control. HOW DO I KNOW IF MY BABY IS DEVELOPING WELL? At each prenatal visit, your health care provider will do several different tests to check on your health and keep track of your baby's development. These include:  Fundal height.  Your health care provider will measure your growing belly from top to bottom using a tape measure.  Your health care provider will also feel your belly to determine your baby's position.  Heartbeat.  An ultrasound in the first trimester can confirm  pregnancy and show a heartbeat, depending on how far along you are.  Your health care provider will check your baby's heart rate at every prenatal visit.  As you get closer to your delivery date, you may have regular fetal heart rate monitoring to make sure that your baby is not in distress.  Second trimester ultrasound.  This ultrasound checks your baby's development. It also indicates your baby's gender. WHAT SHOULD I DO IF I HAVE CONCERNS ABOUT MY BABY'S DEVELOPMENT? Always talk with your health care provider about any concerns that you may have.   This information is not intended to replace advice given to you by your health care provider. Make sure you discuss any questions you have with your health care provider.   Document Released: 07/25/2007 Document Revised: 10/27/2014 Document Reviewed: 07/15/2013 Elsevier Interactive Patient Education 2016 [employer].  Prenatal Care WHAT IS PRENATAL  CARE?  Prenatal care is the process of caring for a pregnant woman before she gives birth. Prenatal care makes sure that she and her baby remain as healthy as possible throughout pregnancy. Prenatal care may be provided by a midwife, family practice health care provider, or a childbirth and pregnancy specialist (obstetrician). Prenatal care may include physical examinations, testing, treatments, and education on nutrition, lifestyle, and social support services. WHY IS PRENATAL CARE SO IMPORTANT?  Early and consistent prenatal care increases the chance that you and your baby will remain healthy throughout your pregnancy. This type of care also decreases a baby's risk of being born too early (prematurely), or being born smaller than expected (small for gestational age). Any underlying medical conditions you may have that could pose a risk during your pregnancy are discussed during prenatal care visits. You will also be monitored regularly for any new conditions that may arise during your pregnancy so they can be treated quickly and effectively. WHAT HAPPENS DURING PRENATAL CARE VISITS? Prenatal care visits may include the following: Discussion Tell your health care provider about any new signs or symptoms you have experienced since your last visit. These might include:  Nausea or vomiting.  Increased or decreased level of energy.  Difficulty sleeping.  Back or leg pain.  Weight changes.  Frequent urination.  Shortness of breath with physical activity.  Changes in your skin, such as the development of a rash or itchiness.  Vaginal discharge or bleeding.  Feelings of excitement or nervousness.  Changes in your baby's movements. You may want to write down any questions or topics you want to discuss with your health care provider and bring them with you to your appointment. Examination During your first prenatal care visit, you will likely have a complete physical exam. Your health care  provider will often examine your vagina, cervix, and the position of your uterus, as well as check your heart, lungs, and other body systems. As your pregnancy progresses, your health care provider will measure the size of your uterus and your baby's position inside your uterus. He or she may also examine you for early signs of labor. Your prenatal visits may also include checking your blood pressure and, after about 10-12 weeks of pregnancy, listening to your baby's heartbeat. Testing Regular testing often includes:  Urinalysis. This checks your urine for glucose, protein, or signs of infection.  Blood count. This checks the levels of white and red blood cells in your body.  Tests for sexually transmitted infections (STIs). Testing for STIs at the beginning of pregnancy is routinely done and is required in many  states.  Antibody testing. You will be checked to see if you are immune to certain illnesses, such as rubella, that can affect a developing fetus.  Glucose screen. Around 24-28 weeks of pregnancy, your blood glucose level will be checked for signs of gestational diabetes. Follow-up tests may be recommended.  Group B strep. This is a bacteria that is commonly found inside a woman's vagina. This test will inform your health care provider if you need an antibiotic to reduce the amount of this bacteria in your body prior to labor and childbirth.  Ultrasound. Many pregnant women undergo an ultrasound screening around 18-20 weeks of pregnancy to evaluate the health of the fetus and check for any developmental abnormalities.  HIV (human immunodeficiency virus) testing. Early in your pregnancy, you will be screened for HIV. If you are at high risk for HIV, this test may be repeated during your third trimester of pregnancy. You may be offered other testing based on your age, personal or family medical history, or other factors.  HOW OFTEN SHOULD I PLAN TO SEE MY HEALTH CARE PROVIDER FOR PRENATAL  CARE? Your prenatal care check-up schedule depends on any medical conditions you have before, or develop during, your pregnancy. If you do not have any underlying medical conditions, you will likely be seen for checkups:  Monthly, during the first 6 months of pregnancy.  Twice a month during months 7 and 8 of pregnancy.  Weekly starting in the 9th month of pregnancy and until delivery. If you develop signs of early labor or other concerning signs or symptoms, you may need to see your health care provider more often. Ask your health care provider what prenatal care schedule is best for you. WHAT CAN I DO TO KEEP MYSELF AND MY BABY AS HEALTHY AS POSSIBLE DURING MY PREGNANCY?  Take a prenatal vitamin containing 400 micrograms (0.4 mg) of folic acid every day. Your health care provider may also ask you to take additional vitamins such as iodine, vitamin D, iron, copper, and zinc.  Take 1500-2000 mg of calcium daily starting at your 20th week of pregnancy until you deliver your baby.  Make sure you are up to date on your vaccinations. Unless directed otherwise by your health care provider:  You should receive a tetanus, diphtheria, and pertussis (Tdap) vaccination between the 27th and 36th week of your pregnancy, regardless of when your last Tdap immunization occurred. This helps protect your baby from whooping cough (pertussis) after he or she is born.  You should receive an annual inactivated influenza vaccine (IIV) to help protect you and your baby from influenza. This can be done at any point during your pregnancy.  Eat a well-rounded diet that includes:  Fresh fruits and vegetables.  Lean proteins.  Calcium-rich foods such as milk, yogurt, hard cheeses, and dark, leafy greens.  Whole grain breads.  Do noteat seafood high in mercury, including:  Swordfish.  Tilefish.  Shark.  King mackerel.  More than 6 oz tuna per week.  Do not eat:  Raw or undercooked meats or  eggs.  Unpasteurized foods, such as soft cheeses (brie, blue, or feta), juices, and milks.  Lunch meats.  Hot dogs that have not been heated until they are steaming.  Drink enough water to keep your urine clear or pale yellow. For many women, this may be 10 or more 8 oz glasses of water each day. Keeping yourself hydrated helps deliver nutrients to your baby and may prevent the start of pre-term uterine  contractions.  Do not use any tobacco products including cigarettes, chewing tobacco, or electronic cigarettes. If you need help quitting, ask your health care provider.  Do not drink beverages containing alcohol. No safe level of alcohol consumption during pregnancy has been determined.  Do not use any illegal drugs. These can harm your developing baby or cause a miscarriage.  Ask your health care provider or pharmacist before taking any prescription or over-the-counter medicines, herbs, or supplements.  Limit your caffeine intake to no more than 200 mg per day.  Exercise. Unless told otherwise by your health care provider, try to get 30 minutes of moderate exercise most days of the week. Do not  do high-impact activities, contact sports, or activities with a high risk of falling, such as horseback riding or downhill skiing.  Get plenty of rest.  Avoid anything that raises your body temperature, such as hot tubs and saunas.  If you own a cat, do not empty its litter box. Bacteria contained in cat feces can cause an infection called toxoplasmosis. This can result in serious harm to the fetus.  Stay away from chemicals such as insecticides, lead, mercury, and cleaning or paint products that contain solvents.  Do not have any X-rays taken unless medically necessary.  Take a childbirth and breastfeeding preparation class. Ask your health care provider if you need a referral or recommendation.   This information is not intended to replace advice given to you by your health care provider.  Make sure you discuss any questions you have with your health care provider.   Document Released: 02/08/2003 Document Revised: 02/26/2014 Document Reviewed: 04/22/2013 Elsevier Interactive Patient Education [employer] Third Trimester of Pregnancy The third trimester is from week 29 through week 42, months 7 through 9. The third trimester is a time when the fetus is growing rapidly. At the end of the ninth month, the fetus is about 20 inches in length and weighs 6-10 pounds.  BODY CHANGES Your body goes through many changes during pregnancy. The changes vary from woman to woman.   Your weight will continue to increase. You can expect to gain 25-35 pounds (11-16 kg) by the end of the pregnancy.  You may begin to get stretch marks on your hips, abdomen, and breasts.  You may urinate more often because the fetus is moving lower into your pelvis and pressing on your bladder.  You may develop or continue to have heartburn as a result of your pregnancy.  You may develop constipation because certain hormones are causing the muscles that push waste through your intestines to slow down.  You may develop hemorrhoids or swollen, bulging veins (varicose veins).  You may have pelvic pain because of the weight gain and pregnancy hormones relaxing your joints between the bones in your pelvis. Backaches may result from overexertion of the muscles supporting your posture.  You may have changes in your hair. These can include thickening of your hair, rapid growth, and changes in texture. Some women also have hair loss during or after pregnancy, or hair that feels dry or thin. Your hair will most likely return to normal after your baby is born.  Your breasts will continue to grow and be tender. A yellow discharge may leak from your breasts called colostrum.  Your belly button may stick out.  You may feel short of breath because of your expanding uterus.  You may notice the fetus "dropping," or  moving lower in your abdomen.  You may have a  bloody mucus discharge. This usually occurs a few days to a week before labor begins.  Your cervix becomes thin and soft (effaced) near your due date. WHAT TO EXPECT AT YOUR PRENATAL EXAMS  You will have prenatal exams every 2 weeks until week 36. Then, you will have weekly prenatal exams. During a routine prenatal visit:  You will be weighed to make sure you and the fetus are growing normally.  Your blood pressure is taken.  Your abdomen will be measured to track your baby's growth.  The fetal heartbeat will be listened to.  Any test results from the previous visit will be discussed.  You may have a cervical check near your due date to see if you have effaced. At around 36 weeks, your caregiver will check your cervix. At the same time, your caregiver will also perform a test on the secretions of the vaginal tissue. This test is to determine if a type of bacteria, Group B streptococcus, is present. Your caregiver will explain this further. Your caregiver may ask you:  What your birth plan is.  How you are feeling.  If you are feeling the baby move.  If you have had any abnormal symptoms, such as leaking fluid, bleeding, severe headaches, or abdominal cramping.  If you are using any tobacco products, including cigarettes, chewing tobacco, and electronic cigarettes.  If you have any questions. Other tests or screenings that may be performed during your third trimester include:  Blood tests that check for low iron levels (anemia).  Fetal testing to check the health, activity level, and growth of the fetus. Testing is done if you have certain medical conditions or if there are problems during the pregnancy.  HIV (human immunodeficiency virus) testing. If you are at high risk, you may be screened for HIV during your third trimester of pregnancy. FALSE LABOR You may feel small, irregular contractions that eventually go away. These are  called Braxton Hicks contractions, or false labor. Contractions may last for hours, days, or even weeks before true labor sets in. If contractions come at regular intervals, intensify, or become painful, it is best to be seen by your caregiver.  SIGNS OF LABOR   Menstrual-like cramps.  Contractions that are 5 minutes apart or less.  Contractions that start on the top of the uterus and spread down to the lower abdomen and back.  A sense of increased pelvic pressure or back pain.  A watery or bloody mucus discharge that comes from the vagina. If you have any of these signs before the 37th week of pregnancy, call your caregiver right away. You need to go to the hospital to get checked immediately. HOME CARE INSTRUCTIONS   Avoid all smoking, herbs, alcohol, and unprescribed drugs. These chemicals affect the formation and growth of the baby.  Do not use any tobacco products, including cigarettes, chewing tobacco, and electronic cigarettes. If you need help quitting, ask your health care provider. You may receive counseling support and other resources to help you quit.  Follow your caregiver's instructions regarding medicine use. There are medicines that are either safe or unsafe to take during pregnancy.  Exercise only as directed by your caregiver. Experiencing uterine cramps is a good sign to stop exercising.  Continue to eat regular, healthy meals.  Wear a good support bra for breast tenderness.  Do not use hot tubs, steam rooms, or saunas.  Wear your seat belt at all times when driving.  Avoid raw meat, uncooked cheese, cat litter  boxes, and soil used by cats. These carry germs that can cause birth defects in the baby.  Take your prenatal vitamins.  Take 1500-2000 mg of calcium daily starting at the 20th week of pregnancy until you deliver your baby.  Try taking a stool softener (if your caregiver approves) if you develop constipation. Eat more high-fiber foods, such as fresh  vegetables or fruit and whole grains. Drink plenty of fluids to keep your urine clear or pale yellow.  Take warm sitz baths to soothe any pain or discomfort caused by hemorrhoids. Use hemorrhoid cream if your caregiver approves.  If you develop varicose veins, wear support hose. Elevate your feet for 15 minutes, 3-4 times a day. Limit salt in your diet.  Avoid heavy lifting, wear low heal shoes, and practice good posture.  Rest a lot with your legs elevated if you have leg cramps or low back pain.  Visit your dentist if you have not gone during your pregnancy. Use a soft toothbrush to brush your teeth and be gentle when you floss.  A sexual relationship may be continued unless your caregiver directs you otherwise.  Do not travel far distances unless it is absolutely necessary and only with the approval of your caregiver.  Take prenatal classes to understand, practice, and ask questions about the labor and delivery.  Make a trial run to the hospital.  Pack your hospital bag.  Prepare the baby's nursery.  Continue to go to all your prenatal visits as directed by your caregiver. SEEK MEDICAL CARE IF:  You are unsure if you are in labor or if your water has broken.  You have dizziness.  You have mild pelvic cramps, pelvic pressure, or nagging pain in your abdominal area.  You have persistent nausea, vomiting, or diarrhea.  You have a bad smelling vaginal discharge.  You have pain with urination. SEEK IMMEDIATE MEDICAL CARE IF:   You have a fever.  You are leaking fluid from your vagina.  You have spotting or bleeding from your vagina.  You have severe abdominal cramping or pain.  You have rapid weight loss or gain.  You have shortness of breath with chest pain.  You notice sudden or extreme swelling of your face, hands, ankles, feet, or legs.  You have not felt your baby move in over an hour.  You have severe headaches that do not go away with medicine.  You have  vision changes.   This information is not intended to replace advice given to you by your health care provider. Make sure you discuss any questions you have with your health care provider.   Document Released: 01/30/2001 Document Revised: 02/26/2014 Document Reviewed: 04/08/2012 Elsevier Interactive Patient Education 2016 [employer] Premature Rupture and Preterm Premature Rupture of Membranes Premature rupture of membranes (PROM) is when the membranes (amniotic sac) break open before contractions or labor starts. Rupture of membranes is commonly referred to as your water breaking. If PROM occurs before 37 weeks of pregnancy, it is called preterm premature rupture of membranes (PPROM). The amniotic sac holds the fetus, keeps infection out, and performs other important functions. Having the amniotic sac rupture before 37 weeks of pregnancy can lead to serious problems and requires immediate attention by your health care provider. CAUSES  PROM near the end of the pregnancy may be caused by natural weakening of the membranes. PPROM is often due to an infection. Other factors that may be associated with PROM include:  Stretching of the amniotic sac because  of carrying multiples or having too much amniotic fluid.  Trauma.  Smoking during pregnancy.  Poor nutrition.  Previous preterm birth.  Vaginal bleeding.  Little to no prenatal care.  Problems with the placenta, such as placenta previa or placental abruption. RISKS OF PROM AND PPROM  Delivering a premature baby.  Getting a serious infection of the placental tissues (chorioamnionitis).  Early detachment of the placenta from the uterus (placental abruption).  Compression of the umbilical cord.  Needing a cesarean birth.  Developing a serious infection after delivery. SIGNS OF PROM OR PPROM   A sudden gush or slow leaking of fluid from the vagina.  Constant wet underwear. Sometimes, women mistake the leaking or wetness for  urine, especially if the leak is slow and not a gush of fluid. If there is constant leaking or your underwear continues to get wet, your membranes have likely ruptured. WHAT TO DO IF YOU THINK YOUR MEMBRANES HAVE RUPTURED Call your health care provider right away. You will need to go to the hospital to get checked immediately. WHAT HAPPENS IF YOU ARE DIAGNOSED WITH PROM OR PPROM? Once you arrive at the hospital, you will have tests done. A cervical exam will be performed to check if the cervix has softened or started to open (dilate). If you are diagnosed with PROM, you may be induced within 24 hours if you are not having contractions. If you are diagnosed with PPROM and are not having contractions, you may be induced depending on your trimester.  If you have PPROM, you:  And your baby will be monitored closely for signs of infection or other complications.  May be given an antibiotic medicine to lower the chances of an infection developing.  May be given a steroid medicine to help mature the baby's lungs faster.  May be given a medicine to stop preterm labor.  May be ordered to be on bed rest at home or in the hospital.  May be induced if complications arise for you or the baby. Your treatment will depend on many factors, such as how far along you are, the development of the baby, and other complications that may arise.   This information is not intended to replace advice given to you by your health care provider. Make sure you discuss any questions you have with your health care provider.   Document Released: 02/05/2005 Document Revised: 11/26/2012 Document Reviewed: 05/27/2012 Elsevier Interactive Patient Education [employer]2016 Elsevier Inc.

## 2015-08-12 NOTE — Progress Notes (Signed)
Dr Cephus RicherAguilar notified of pt's admission and status. Aware of situation with pt and just needs guidance as to obtaining prenatal care. Will see pt in Triage and make plan of care

## 2015-08-12 NOTE — MAU Note (Addendum)
Dr Cephus RicherAguilar in Triage talking to pt and adoptive mother and pt's mother also in Triage. Pt then d/c home from Triage

## 2015-08-12 NOTE — MAU Note (Signed)
Has only been seen 3 times this pregnancy. Just went to get medicaid. [employer]FA. [facility]. Feet swelling some. Denies LOF or bleeding. No pain

## 2015-08-24 ENCOUNTER — Ambulatory Visit (INDEPENDENT_AMBULATORY_CARE_PROVIDER_SITE_OTHER): Payer: Medicaid Other | Admitting: Family

## 2015-08-24 ENCOUNTER — Other Ambulatory Visit (HOSPITAL_COMMUNITY)
Admission: RE | Admit: 2015-08-24 | Discharge: 2015-08-24 | Disposition: A | Discharge status: Home / Self Care | Payer: Medicaid Other | Source: Ambulatory Visit | Attending: Family | Admitting: Family

## 2015-08-24 ENCOUNTER — Encounter: Payer: Self-pay | Admitting: Family

## 2015-08-24 VITALS — BP 132/69 | HR 79 | Wt 138.9 lb

## 2015-08-24 DIAGNOSIS — Z113 Encounter for screening for infections with a predominantly sexual mode of transmission: Secondary | ICD-10-CM | POA: Diagnosis present

## 2015-08-24 DIAGNOSIS — O09893 Supervision of other high risk pregnancies, third trimester: Secondary | ICD-10-CM

## 2015-08-24 DIAGNOSIS — O0933 Supervision of pregnancy with insufficient antenatal care, third trimester: Secondary | ICD-10-CM | POA: Diagnosis not present

## 2015-08-24 DIAGNOSIS — Z3483 Encounter for supervision of other normal pregnancy, third trimester: Secondary | ICD-10-CM

## 2015-08-24 DIAGNOSIS — Z01419 Encounter for gynecological examination (general) (routine) without abnormal findings: Secondary | ICD-10-CM | POA: Insufficient documentation

## 2015-08-24 DIAGNOSIS — O0930 Supervision of pregnancy with insufficient antenatal care, unspecified trimester: Secondary | ICD-10-CM | POA: Insufficient documentation

## 2015-08-24 DIAGNOSIS — Z3490 Encounter for supervision of normal pregnancy, unspecified, unspecified trimester: Secondary | ICD-10-CM | POA: Insufficient documentation

## 2015-08-24 DIAGNOSIS — Z3689 Encounter for other specified antenatal screening: Secondary | ICD-10-CM

## 2015-08-24 DIAGNOSIS — Z3493 Encounter for supervision of normal pregnancy, unspecified, third trimester: Secondary | ICD-10-CM

## 2015-08-24 LAB — POCT URINALYSIS DIP (DEVICE)
Bilirubin Urine: NEGATIVE
GLUCOSE, UA: NEGATIVE mg/dL
Hgb urine dipstick: NEGATIVE
KETONES UR: NEGATIVE mg/dL
Nitrite: NEGATIVE
Protein, ur: NEGATIVE mg/dL
SPECIFIC GRAVITY, URINE: 1.01 (ref 1.005–1.030)
UROBILINOGEN UA: 0.2 mg/dL (ref 0.0–1.0)
pH: 7 (ref 5.0–8.0)

## 2015-08-24 MED ORDER — CONCEPT DHA 53.5-38-1 MG PO CAPS: 1.0000 | ORAL_CAPSULE | Freq: Every day | ORAL | Status: DC

## 2015-08-24 NOTE — Progress Notes (Signed)
Home Medicaid Form Completed  

## 2015-08-24 NOTE — Progress Notes (Signed)
New ob/28 wk packet given Anatomy US scheduled July 12th @ 1345.  Pt notified.

## 2015-08-24 NOTE — Patient Instructions (Signed)

## 2015-08-24 NOTE — Progress Notes (Signed)
   Subjective:    Morgan Ramirez is a G1P0 3810w1d being seen today for her first obstetrical visit.  Her obstetrical history is significant for late prernatal care.  Desires to place baby for adoption.  Here with Tammy who is "like a mother".  Doing adoption through an attorney.   Pregnancy history fully reviewed.  Patient reports pedal edema.  Filed Vitals:   08/24/15 0759  BP: 132/69  Pulse: 79  Weight: 138 lb 14.4 oz (63.005 kg)    HISTORY: OB History  Gravida Para Term Preterm AB SAB TAB Ectopic Multiple Living  1             # Outcome Date GA Lbr Len/2nd Weight Sex Delivery Anes PTL Lv  1 Current              Past Medical History  Diagnosis Date  . Chronic back pain    Past Surgical History  Procedure Laterality Date  . No past surgeries     Family History  Problem Relation Age of Onset  . Adopted: Yes  . Family history unknown: Yes     Exam    BP 132/69 mmHg  Pulse 79  Wt 138 lb 14.4 oz (63.005 kg)  LMP 01/11/2015 Uterine Size: size equals dates  Pelvic Exam:    Perineum: No Hemorrhoids, Normal Perineum   Vulva: normal   Vagina:  normal mucosa, normal discharge, no palpable nodules   pH: Not done   Cervix: no bleeding following Pap, no cervical motion tenderness and no lesions   Adnexa: normal adnexa and no mass, fullness, tenderness   Bony Pelvis: Adequate  System: Breast:  No nipple retraction or dimpling, No nipple discharge or bleeding, No axillary or supraclavicular adenopathy, Normal to palpation without dominant masses   Skin: normal coloration and turgor, no rashes    Neurologic: negative   Extremities: normal strength, tone, and muscle mass   HEENT neck supple with midline trachea and thyroid without masses   Mouth/Teeth mucous membranes moist, pharynx normal without lesions   Neck supple and no masses   Cardiovascular: regular rate and rhythm, no murmurs or gallops   Respiratory:  appears well, vitals normal, no respiratory distress,  acyanotic, normal RR, neck free of mass or lymphadenopathy, chest clear, no wheezing, crepitations, rhonchi, normal symmetric air entry   Abdomen: soft, non-tender; bowel sounds normal; no masses,  no organomegaly   Urinary: urethral meatus normal      Assessment:    Pregnancy: G1P0 Patient Active Problem List   Diagnosis Date Noted  . Supervision of normal pregnancy, antepartum 08/24/2015  Late Prenatal Care      Plan:     Initial labs drawn.  Pap smear collected.  1 hr added to labs.  Prenatal vitamins. Problem list reviewed and updated. Office with Asher MuirJamie, CSW Genetic Screening:  Late to care  Ultrasound discussed; fetal survey: ordered.  Follow up in 2 weeks.  Marlis EdelsonKARIM, Maze Corniel N 08/24/2015

## 2015-08-25 ENCOUNTER — Encounter: Payer: Self-pay | Admitting: Family

## 2015-08-25 ENCOUNTER — Other Ambulatory Visit: Payer: Self-pay | Admitting: Family

## 2015-08-25 DIAGNOSIS — O99019 Anemia complicating pregnancy, unspecified trimester: Secondary | ICD-10-CM | POA: Insufficient documentation

## 2015-08-25 DIAGNOSIS — O99013 Anemia complicating pregnancy, third trimester: Secondary | ICD-10-CM

## 2015-08-25 LAB — HEMOGLOBINOPATHY EVALUATION
HEMATOCRIT: 29.6 % — AB (ref 35.0–45.0)
HEMOGLOBIN: 9.8 g/dL — AB (ref 11.7–15.5)
HGB A2 QUANT: 2 % (ref 1.8–3.5)
Hgb A: 97 % (ref >96.0)
Hgb F Quant: <1 % (ref <2.0)
MCH: 27.6 pg (ref 27.0–33.0)
MCV: 83.4 fL (ref 80.0–100.0)
RBC: 3.55 MIL/uL — AB (ref 3.80–5.10)
RDW: 13.4 % (ref 11.0–15.0)

## 2015-08-25 LAB — GLUCOSE TOLERANCE, 1 HOUR (50G) W/O FASTING: GLUCOSE, 1 HR, GESTATIONAL: 71 mg/dL (ref <140)

## 2015-08-25 LAB — PRENATAL PROFILE (SOLSTAS)
Antibody Screen: NEGATIVE
BASOS ABS: 0 {cells}/uL (ref 0–200)
Basophils Relative: 0 %
EOS ABS: 97 {cells}/uL (ref 15–500)
EOS PCT: 1 %
HCT: 29.6 % — ABNORMAL LOW (ref 35.0–45.0)
HEMOGLOBIN: 9.8 g/dL — AB (ref 11.7–15.5)
HEP B S AG: NEGATIVE
HIV: NONREACTIVE
LYMPHS ABS: 1358 {cells}/uL (ref 850–3900)
Lymphocytes Relative: 14 %
MCH: 27.6 pg (ref 27.0–33.0)
MCHC: 33.1 g/dL (ref 32.0–36.0)
MCV: 83.4 fL (ref 80.0–100.0)
MONO ABS: 679 {cells}/uL (ref 200–950)
MPV: 10.6 fL (ref 7.5–12.5)
Monocytes Relative: 7 %
NEUTROS ABS: 7566 {cells}/uL (ref 1500–7800)
Neutrophils Relative %: 78 %
Platelets: 268 10*3/uL (ref 140–400)
RBC: 3.55 MIL/uL — ABNORMAL LOW (ref 3.80–5.10)
RDW: 13.4 % (ref 11.0–15.0)
RUBELLA: 1.35 {index} — AB (ref <0.90)
Rh Type: POSITIVE
WBC: 9.7 10*3/uL (ref 3.8–10.8)

## 2015-08-25 LAB — GC/CHLAMYDIA PROBE AMP (~~LOC~~) NOT AT ARMC
CHLAMYDIA, DNA PROBE: NEGATIVE
NEISSERIA GONORRHEA: NEGATIVE

## 2015-08-25 MED ORDER — FUSION PLUS PO CAPS: 1.0000 | ORAL_CAPSULE | Freq: Every day | ORAL | Status: DC

## 2015-08-25 NOTE — Progress Notes (Signed)
Message routed to staff to notify patient regarding anemia and RX for Fusion Plus.

## 2015-08-26 LAB — CULTURE, OB URINE: Colony Count: 2000

## 2015-08-26 LAB — CYTOLOGY - PAP

## 2015-08-27 ENCOUNTER — Encounter: Payer: Self-pay | Admitting: Family

## 2015-08-27 DIAGNOSIS — B951 Streptococcus, group B, as the cause of diseases classified elsewhere: Secondary | ICD-10-CM | POA: Insufficient documentation

## 2015-08-27 DIAGNOSIS — O2340 Unspecified infection of urinary tract in pregnancy, unspecified trimester: Secondary | ICD-10-CM

## 2015-08-27 LAB — PAIN MGMT, PROFILE 6 CONF W/O MM, U
6 ACETYLMORPHINE: NEGATIVE ng/mL (ref <10)
Alcohol Metabolites: NEGATIVE ng/mL (ref <500)
Amphetamines: NEGATIVE ng/mL (ref <500)
Barbiturates: NEGATIVE ng/mL (ref <300)
Benzodiazepines: NEGATIVE ng/mL (ref <100)
Cocaine Metabolite: NEGATIVE ng/mL (ref <150)
Creatinine: 59.4 mg/dL (ref >=20.0)
Marijuana Metabolite: NEGATIVE ng/mL (ref <20)
Methadone Metabolite: NEGATIVE ng/mL (ref <100)
OPIATES: NEGATIVE ng/mL (ref <100)
OXIDANT: NEGATIVE ug/mL (ref <200)
Oxycodone: NEGATIVE ng/mL (ref <100)
PH: 6.88 (ref 4.5–9.0)
PLEASE NOTE: 0
Phencyclidine: NEGATIVE ng/mL (ref <25)

## 2015-08-30 ENCOUNTER — Telehealth: Payer: Self-pay | Admitting: *Deleted

## 2015-08-30 NOTE — Telephone Encounter (Signed)
Called pt and left message stating that I am calling with test result information. Please call back and state whether a detailed message can be left on your voice mail.

## 2015-08-30 NOTE — Telephone Encounter (Signed)
Patient is requesting test results.

## 2015-08-30 NOTE — Telephone Encounter (Signed)
-----   Message from Marlis EdelsonWalidah N Karim, CNM sent at 08/25/2015  3:33 PM EDT ----- Regarding: Please notify regarding anemia and RX   ----- Message -----    From: [facility] Interface    Sent: 08/24/2015  11:23 PM      To: EAVWUJWWalidah Kennith GainN Karim, CNM

## 2015-08-31 ENCOUNTER — Ambulatory Visit (HOSPITAL_COMMUNITY)
Admission: RE | Admit: 2015-08-31 | Discharge: 2015-08-31 | Disposition: A | Discharge status: Home / Self Care | Payer: Medicaid Other | Source: Ambulatory Visit | Attending: Family | Admitting: Family

## 2015-08-31 ENCOUNTER — Other Ambulatory Visit: Payer: Self-pay | Admitting: Family

## 2015-08-31 DIAGNOSIS — O0933 Supervision of pregnancy with insufficient antenatal care, third trimester: Secondary | ICD-10-CM | POA: Diagnosis not present

## 2015-08-31 DIAGNOSIS — O2690 Pregnancy related conditions, unspecified, unspecified trimester: Secondary | ICD-10-CM | POA: Insufficient documentation

## 2015-08-31 DIAGNOSIS — Z3689 Encounter for other specified antenatal screening: Secondary | ICD-10-CM

## 2015-08-31 DIAGNOSIS — Z36 Encounter for antenatal screening of mother: Secondary | ICD-10-CM | POA: Diagnosis not present

## 2015-08-31 DIAGNOSIS — Z3493 Encounter for supervision of normal pregnancy, unspecified, third trimester: Secondary | ICD-10-CM

## 2015-08-31 DIAGNOSIS — Z3482 Encounter for supervision of other normal pregnancy, second trimester: Secondary | ICD-10-CM

## 2015-08-31 DIAGNOSIS — Z3A33 33 weeks gestation of pregnancy: Secondary | ICD-10-CM | POA: Insufficient documentation

## 2015-08-31 IMAGING — US US MFM OB COMP +14 WKS
1 series · 13 of 28 positions shown · non-contrast
Comparison: none

[Series 1: us mfm ob comp +14 wks · 117 acquisitions, 13 frames shown]
[im 5/117]
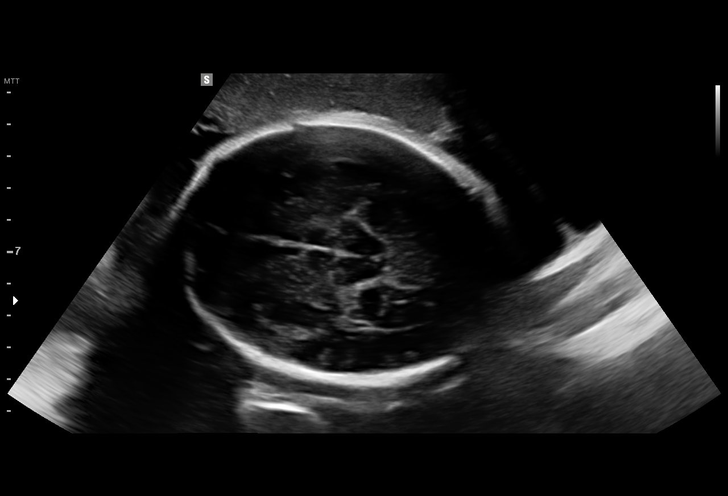
[im 13/117]
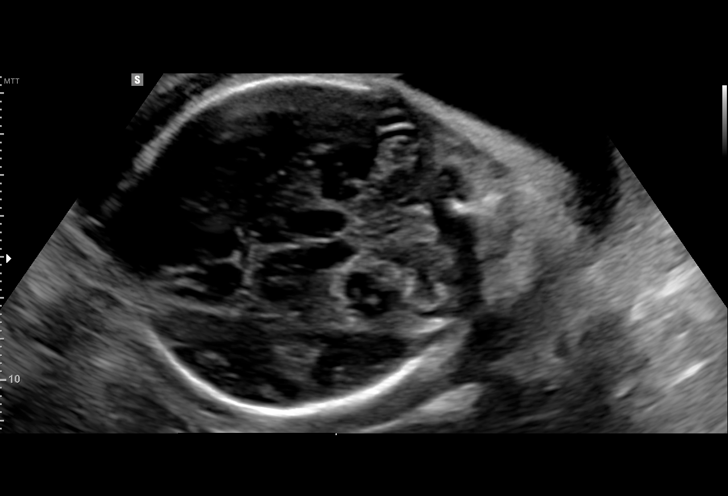
[im 22/117]
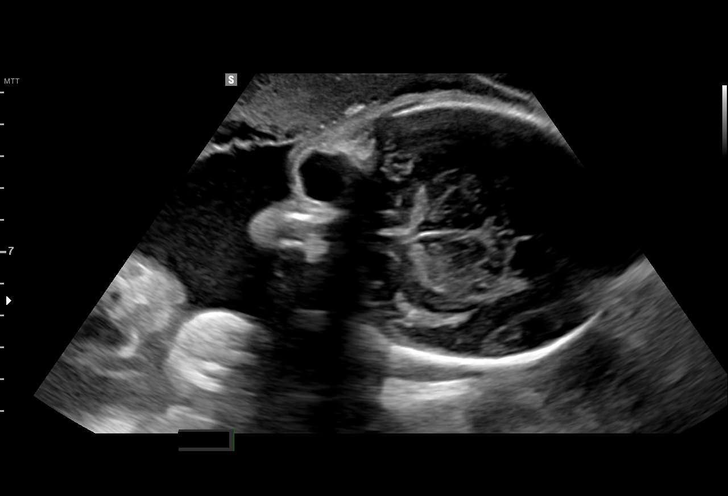
[im 31/117]
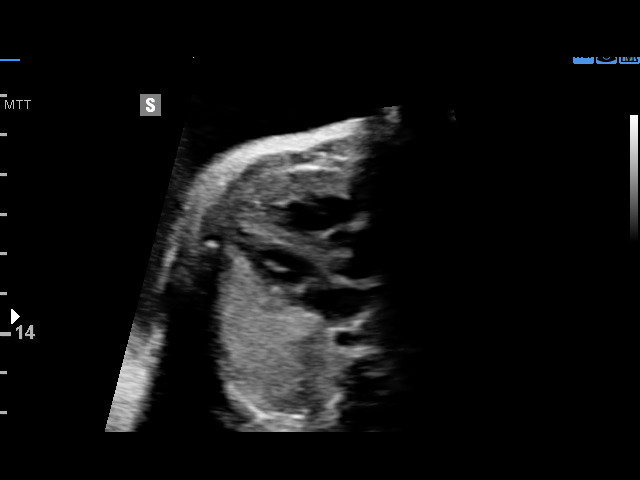
[im 39/117]
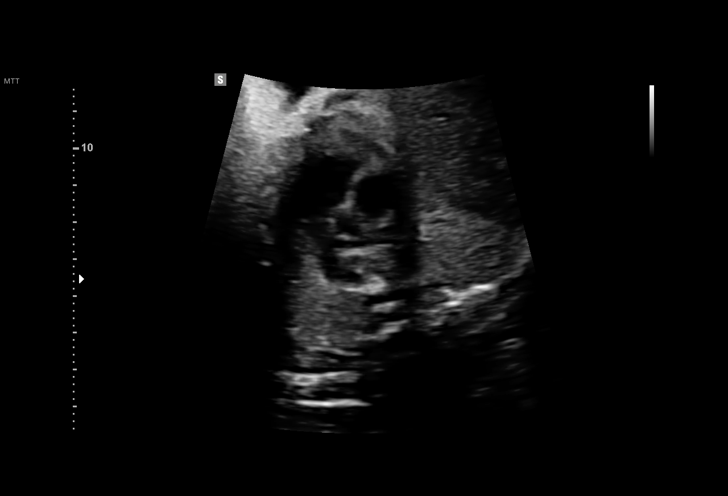
[im 48/117]
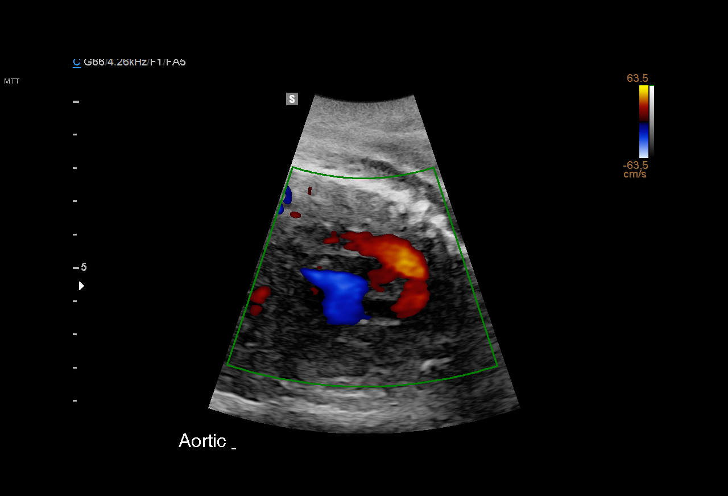
[im 61/117]
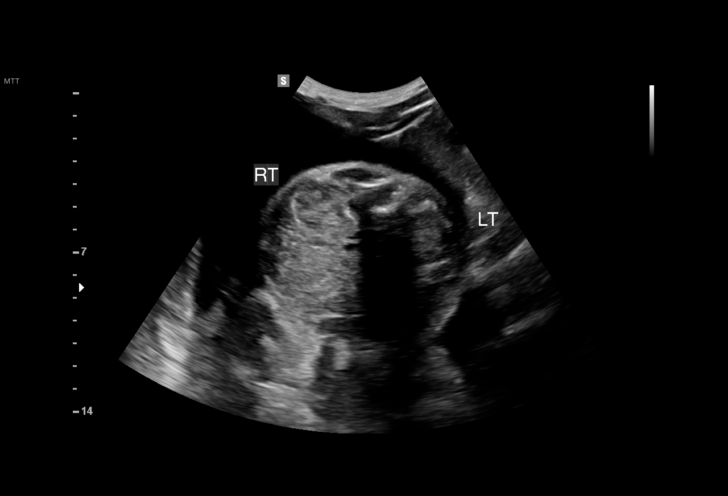
[im 69/117]
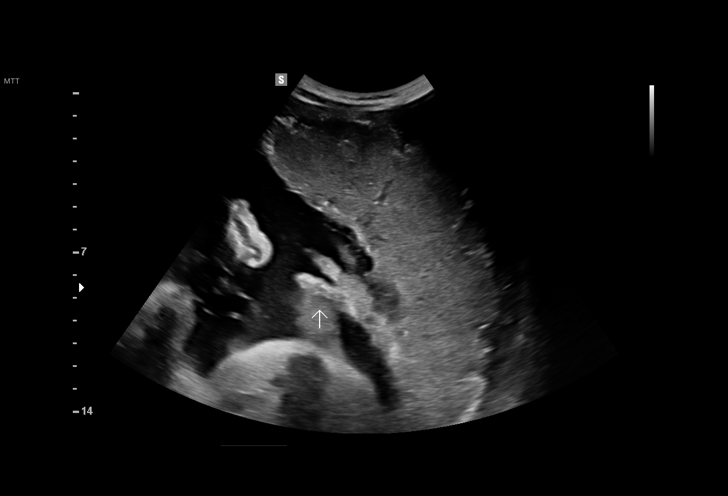
[im 78/117]
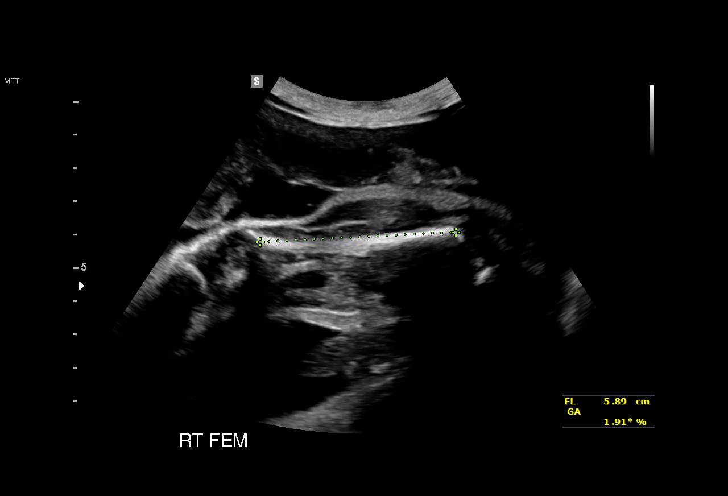
[im 86/117]
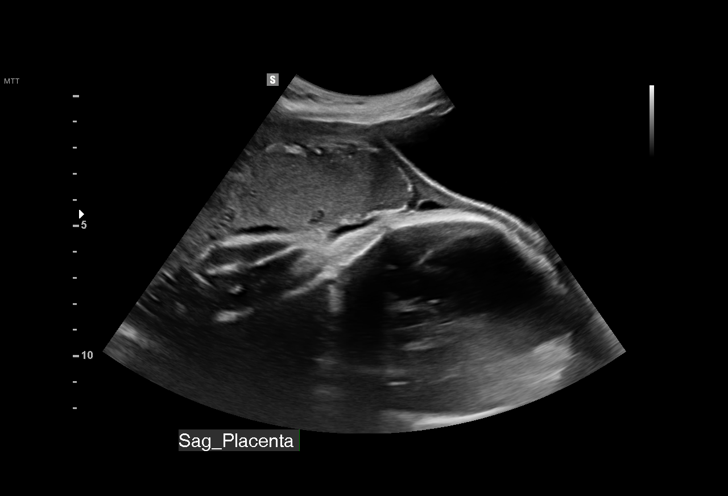
[im 95/117]
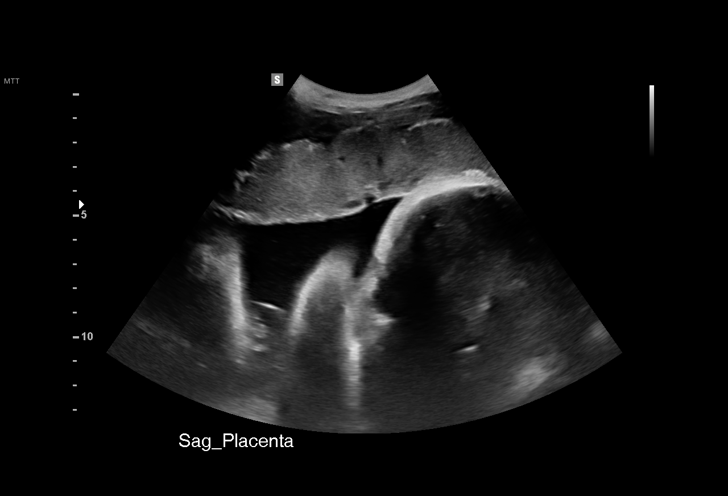
[im 104/117]
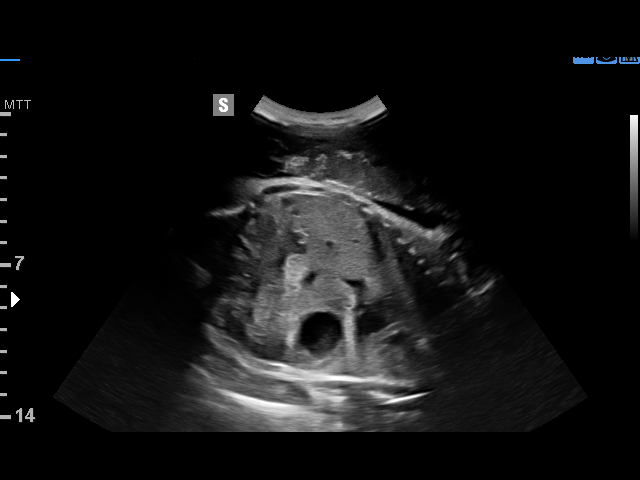
[im 112/117]
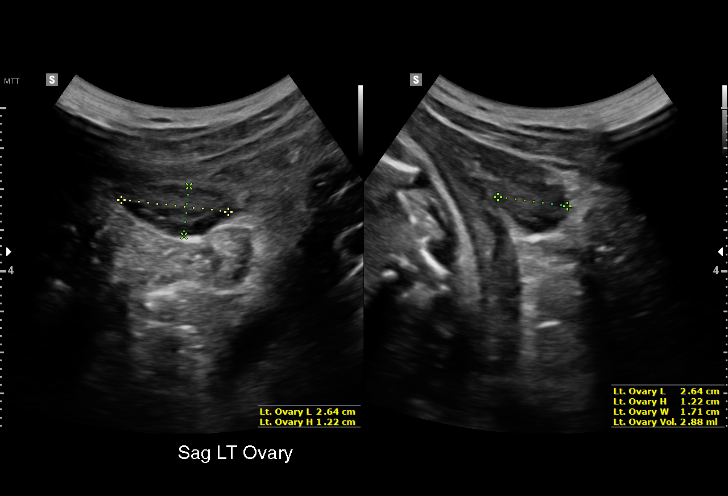

[13 of 28 positions shown; findings below may reference images not displayed]

Hospital Clinic-
Faculty Physician
OB/Gyn Clinic

1  TIGER            [PHONE_NUMBER]      [PHONE_NUMBER]     [PHONE_NUMBER]
Indications

33 weeks gestation of pregnancy
Late to prenatal care, third trimester         [UQ]
Basic anatomic survey                          Z36
Medical complication of pregnancy (anemia)     [UQ]
OB History

Blood Type:            Height:  5'3"   Weight (lb):  138      BMI:
Gravidity:    1         Term:   0        Prem:   0        SAB:   0
TOP:          0       Ectopic:  0        Living: 0
Fetal Evaluation

Num Of Fetuses:     1
Fetal Heart         133
Rate(bpm):
Cardiac Activity:   Observed
Presentation:       Cephalic
Placenta:           Anterior, above cervical os
P. Cord Insertion:  Visualized, central

Amniotic Fluid
AFI FV:      Subjectively within normal limits

AFI Sum(cm)     %Tile       Largest Pocket(cm)
21.89           83
RUQ(cm)       RLQ(cm)       LUQ(cm)        LLQ(cm)
5.12
Biometry

BPD:      79.6  mm     G. Age:  32w 0d         14  %    CI:        75.27   %   70 - 86
FL/HC:      20.5   %   19.9 -
HC:       291   mm     G. Age:  32w 1d          3  %    HC/AC:      1.02       0.96 -
AC:      285.5  mm     G. Age:  32w 4d         35  %    FL/BPD:     74.9   %   71 - 87
FL:       59.6  mm     G. Age:  31w 0d          4  %    FL/AC:      20.9   %   20 - 24
HUM:      51.9  mm     G. Age:  30w 2d        < 5  %
CER:      43.3  mm     G. Age:  37w 2d       > 95  %
CM:        4.6  mm

Est. FW:    [UQ]  gm      4 lb 2 oz     37  %
Gestational Age

LMP:           33w 1d       Date:   [DATE]                 EDD:   [DATE]
U/S Today:     32w 0d                                        EDD:   [DATE]
Best:          33w 1d    Det. By:   LMP  ([DATE])          EDD:   [DATE]
Anatomy

Cranium:               Appears normal         LVOT:                   Appears normal
Cavum:                 Appears normal         Aortic Arch:            Appears normal
Ventricles:            Appears normal         Ductal Arch:            Appears normal
Choroid Plexus:        Appears normal         Diaphragm:              Appears normal
Cerebellum:            Appears normal         Stomach:                Appears normal, left
sided
Posterior Fossa:       Appears normal         Abdomen:                Appears normal
Nuchal Fold:           Not applicable (>20    Abdominal Wall:         Appears nml (cord
wks GA)                                        insert, abd wall)
Face:                  Appears normal         Cord Vessels:           Appears normal (3
(orbits and profile)                           vessel cord)
Lips:                  Appears normal         Kidneys:                Appear normal
Palate:                Not well visualized    Bladder:                Appears normal
Thoracic:              Appears normal         Spine:                  Appears normal
Heart:                 Appears normal         Upper Extremities:      Visualized
(4CH, axis, and situs
RVOT:                  Appears normal         Lower Extremities:      Visualized

Other:  Fetus appears to be a male. Technically difficult due to advanced GA
and fetal position.
Cervix Uterus Adnexa

Cervix
Not visualized (advanced GA >[UQ])

Uterus
No abnormality visualized.

Left Ovary
Size(cm)     2.64  x    1.71   x  1.22      Vol(ml):
Within normal limits. No adnexal mass visualized.

Right Ovary
Size(cm)     2.58  x    2.39   x  1.43      Vol(ml):
Within normal limits. No adnexal mass visualized.
Cul De Sac:   No free fluid seen.
Adnexa:       No abnormality visualized.
Impression

Single IUP at 33w 1d
Late prenatal care
Normal fetal anatomic survey, although somewhat limited due
to late gestational age
The estimated fetal weight today is at the 37th %tile
Normal amniotic fluid volume
Recommendations

Follow-up ultrasounds as clinically indicated.

## 2015-09-01 ENCOUNTER — Inpatient Hospital Stay (HOSPITAL_COMMUNITY)
Admission: AD | Admit: 2015-09-01 | Discharge: 2015-09-01 | Disposition: A | Discharge status: Home / Self Care | Payer: Medicaid Other | Source: Ambulatory Visit | Attending: Obstetrics & Gynecology | Admitting: Obstetrics & Gynecology

## 2015-09-01 DIAGNOSIS — O26899 Other specified pregnancy related conditions, unspecified trimester: Secondary | ICD-10-CM

## 2015-09-01 DIAGNOSIS — O9989 Other specified diseases and conditions complicating pregnancy, childbirth and the puerperium: Secondary | ICD-10-CM | POA: Diagnosis not present

## 2015-09-01 DIAGNOSIS — O2343 Unspecified infection of urinary tract in pregnancy, third trimester: Secondary | ICD-10-CM

## 2015-09-01 DIAGNOSIS — B951 Streptococcus, group B, as the cause of diseases classified elsewhere: Secondary | ICD-10-CM

## 2015-09-01 DIAGNOSIS — O0933 Supervision of pregnancy with insufficient antenatal care, third trimester: Secondary | ICD-10-CM

## 2015-09-01 DIAGNOSIS — Z3A33 33 weeks gestation of pregnancy: Secondary | ICD-10-CM | POA: Diagnosis present

## 2015-09-01 DIAGNOSIS — M549 Dorsalgia, unspecified: Secondary | ICD-10-CM | POA: Diagnosis not present

## 2015-09-01 DIAGNOSIS — O26893 Other specified pregnancy related conditions, third trimester: Secondary | ICD-10-CM | POA: Insufficient documentation

## 2015-09-01 DIAGNOSIS — R102 Pelvic and perineal pain: Secondary | ICD-10-CM | POA: Insufficient documentation

## 2015-09-01 DIAGNOSIS — Z3483 Encounter for supervision of other normal pregnancy, third trimester: Secondary | ICD-10-CM

## 2015-09-01 DIAGNOSIS — R109 Unspecified abdominal pain: Secondary | ICD-10-CM | POA: Diagnosis not present

## 2015-09-01 DIAGNOSIS — Z3490 Encounter for supervision of normal pregnancy, unspecified, unspecified trimester: Secondary | ICD-10-CM

## 2015-09-01 DIAGNOSIS — O99013 Anemia complicating pregnancy, third trimester: Secondary | ICD-10-CM

## 2015-09-01 LAB — URINE MICROSCOPIC-ADD ON
BACTERIA UA: NONE SEEN
RBC / HPF: NONE SEEN RBC/hpf (ref 0–5)

## 2015-09-01 LAB — URINALYSIS, ROUTINE W REFLEX MICROSCOPIC
Bilirubin Urine: NEGATIVE
Glucose, UA: NEGATIVE mg/dL
Hgb urine dipstick: NEGATIVE
KETONES UR: NEGATIVE mg/dL
NITRITE: NEGATIVE
Protein, ur: NEGATIVE mg/dL
Specific Gravity, Urine: <1.005 — ABNORMAL LOW (ref 1.005–1.030)
pH: 6 (ref 5.0–8.0)

## 2015-09-01 MED ORDER — PENICILLIN G POTASSIUM 5000000 UNITS IJ SOLR
5.0000 10*6.[IU] | Freq: Once | INTRAMUSCULAR | Status: AC
  Administered 2015-09-01: 5 10*6.[IU] via INTRAVENOUS
  Filled 2015-09-01: qty 5

## 2015-09-01 MED ORDER — LACTATED RINGERS IV BOLUS (SEPSIS)
1000.0000 mL | Freq: Once | INTRAVENOUS | Status: AC
  Administered 2015-09-01: 1000 mL via INTRAVENOUS

## 2015-09-01 MED ORDER — AMOXICILLIN 500 MG PO CAPS: 500.0000 mg | ORAL_CAPSULE | Freq: Three times a day (TID) | ORAL | Status: DC

## 2015-09-01 NOTE — MAU Note (Signed)
Pt reports she has been having back pain off and one x 1 week. Also c/o increased pelvic pressure. Pt thinks it mught be a bladder infection. Denies pain or burning when she urinates. Maybe some increase in urination.

## 2015-09-01 NOTE — MAU Note (Signed)
Pt received to MAU c/o back pain x1 week rating 7/10 on pain scale. Patient also states that she is having a pressure and tightening type pain in her pelvis 5/10 on pain scale. Patient placed on EFM. FHR 145. Abdomen soft on palpation. Pt denies vaginal bleeding or leaking fluid. Morgan DaneERRI L Arsema Tusing, RN

## 2015-09-01 NOTE — MAU Provider Note (Signed)
History     CSN: [id number]  Arrival date and time: 09/01/15 1625   None     Chief Complaint  Patient presents with  . Back Pain  . Pelvic Pain   HPI Pt is 4252w2d pregnant G1P0 who presents with lower abdominal pressure and thinks she may have a UTI. Pt denies pain with urination.  Pt is feeling contractions. Pt sees [facility] for OB care- with late to care- pt denies vaginal bleeding or LOF. RN note:  Pt received to MAU c/o back pain x1 week rating 7/10 on pain scale. Patient also states that she is having a pressure and tightening type pain in her pelvis 5/10 on pain scale. Patient placed on EFM. FHR 145. Abdomen soft on palpation. Pt denies vaginal bleeding or leaking fluid. Carmelina DaneERRI L TAEUBER, RN         Past Medical History  Diagnosis Date  . Chronic back pain     Past Surgical History  Procedure Laterality Date  . No past surgeries      Family History  Problem Relation Age of Onset  . Adopted: Yes  . Family history unknown: Yes    Social History  Substance Use Topics  . Smoking status: Never Smoker   . Smokeless tobacco: Never Used  . Alcohol Use: No    Allergies: No Known Allergies  Prescriptions prior to admission  Medication Sig Dispense Refill Last Dose  . calcium carbonate (TUMS - DOSED IN MG ELEMENTAL CALCIUM) 500 MG chewable tablet Chew 2 tablets by mouth as needed for indigestion or heartburn.   08/31/2015 at Unknown time  . Prenatal Vit-Fe Fumarate-FA (MULTIVITAMIN-PRENATAL) 27-0.8 MG TABS tablet Take 1 tablet by mouth daily at 12 noon.   09/01/2015 at Unknown time  . Iron-FA-B Cmp-C-Biot-Probiotic (FUSION PLUS) CAPS Take 1 capsule by mouth daily. (Patient not taking: Reported on 09/01/2015) 30 capsule 6   . Prenat-FeFum-FePo-FA-Omega 3 (CONCEPT DHA) 53.5-38-1 MG CAPS Take 1 tablet by mouth daily. (Patient not taking: Reported on 09/01/2015) 30 capsule 2     Review of Systems  Constitutional: Negative for fever and chills.  Gastrointestinal: Positive  for nausea and abdominal pain. Negative for vomiting.  Genitourinary: Negative for dysuria.  Musculoskeletal: Positive for back pain.       Resolved  Neurological: Negative for dizziness and headaches.   Physical Exam   Blood pressure 105/62, pulse 85, temperature 98.7 F (37.1 C), resp. rate 18, height 5\' 3"  (1.6 m), weight 142 lb 12.8 oz (64.774 kg), last menstrual period 01/11/2015.  Physical Exam  Nursing note and vitals reviewed. Constitutional: She is oriented to person, place, and time. She appears well-developed and well-nourished. No distress.  HENT:  Head: Normocephalic.  Eyes: Pupils are equal, round, and reactive to light.  Neck: Normal range of motion. Neck supple.  Cardiovascular: Normal rate.   Respiratory: Effort normal.  GI: Soft. She exhibits no distension. There is no rebound and no guarding.  Mild irregular ctx Reactive NST  Genitourinary:  Cervix 1 cm 50% effaced  Musculoskeletal: Normal range of motion.  Neurological: She is alert and oriented to person, place, and time.  Skin: Skin is warm and dry.  Psychiatric: She has a normal mood and affect.    MAU Course  Procedures Results for orders placed or performed during the hospital encounter of 09/01/15 (from the past 24 hour(s))  Urinalysis, Routine w reflex microscopic ([facility])     Status: Abnormal   Collection Time: 09/01/15  4:40 PM  Result Value Ref Range   Color, Urine STRAW (A) YELLOW   APPearance CLEAR CLEAR   Specific Gravity, Urine <1.005 (L) 1.005 - 1.030   pH 6.0 5.0 - 8.0   Glucose, UA NEGATIVE NEGATIVE mg/dL   Hgb urine dipstick NEGATIVE NEGATIVE   Bilirubin Urine NEGATIVE NEGATIVE   Ketones, ur NEGATIVE NEGATIVE mg/dL   Protein, ur NEGATIVE NEGATIVE mg/dL   Nitrite NEGATIVE NEGATIVE   Leukocytes, UA TRACE (A) NEGATIVE  Urine microscopic-add on     Status: Abnormal   Collection Time: 09/01/15  4:40 PM  Result Value Ref Range   Squamous Epithelial / LPF 0-5 (A) NONE SEEN   WBC,  UA 0-5 0 - 5 WBC/hpf   RBC / HPF NONE SEEN 0 - 5 RBC/hpf   Bacteria, UA NONE SEEN NONE SEEN  IVF LR with Penicillin 5 million units given for +GBS  UI and ctx mild and irregular- pt comfortable and ready to go home Fetal monitoring baseline 135 bpm; 6-25 bpm variability with 10x10 and 15x15 accelerations;  Assessment and Plan  Abdominal pain in 3rd trimester Pelvic pain +GBS- Rx Amoxicillin  TID Keep OB appointment- return for any concerns, LOF , bleeding or increase in pain  Tarris Delbene 09/01/2015, 5:56 PM

## 2015-09-02 ENCOUNTER — Encounter: Payer: Self-pay | Admitting: Family

## 2015-09-05 NOTE — Telephone Encounter (Signed)
Spoke with patient in regards to anemia and rx

## 2015-09-11 ENCOUNTER — Encounter (HOSPITAL_COMMUNITY): Payer: Self-pay | Admitting: Certified Nurse Midwife

## 2015-09-11 ENCOUNTER — Observation Stay (HOSPITAL_COMMUNITY)
Admission: AD | Admit: 2015-09-11 | Discharge: 2015-09-12 | Disposition: A | Discharge status: Home / Self Care | Payer: Medicaid Other | Source: Ambulatory Visit | Attending: Obstetrics and Gynecology | Admitting: Obstetrics and Gynecology

## 2015-09-11 DIAGNOSIS — O4703 False labor before 37 completed weeks of gestation, third trimester: Secondary | ICD-10-CM | POA: Diagnosis not present

## 2015-09-11 DIAGNOSIS — O99013 Anemia complicating pregnancy, third trimester: Secondary | ICD-10-CM

## 2015-09-11 DIAGNOSIS — B951 Streptococcus, group B, as the cause of diseases classified elsewhere: Secondary | ICD-10-CM

## 2015-09-11 DIAGNOSIS — Z3482 Encounter for supervision of other normal pregnancy, second trimester: Secondary | ICD-10-CM

## 2015-09-11 DIAGNOSIS — Z3490 Encounter for supervision of normal pregnancy, unspecified, unspecified trimester: Secondary | ICD-10-CM

## 2015-09-11 DIAGNOSIS — A5901 Trichomonal vulvovaginitis: Secondary | ICD-10-CM | POA: Insufficient documentation

## 2015-09-11 DIAGNOSIS — O23593 Infection of other part of genital tract in pregnancy, third trimester: Secondary | ICD-10-CM

## 2015-09-11 DIAGNOSIS — Z3A34 34 weeks gestation of pregnancy: Secondary | ICD-10-CM | POA: Insufficient documentation

## 2015-09-11 DIAGNOSIS — O4700 False labor before 37 completed weeks of gestation, unspecified trimester: Secondary | ICD-10-CM | POA: Diagnosis present

## 2015-09-11 DIAGNOSIS — O0933 Supervision of pregnancy with insufficient antenatal care, third trimester: Secondary | ICD-10-CM

## 2015-09-11 DIAGNOSIS — O2343 Unspecified infection of urinary tract in pregnancy, third trimester: Secondary | ICD-10-CM

## 2015-09-11 DIAGNOSIS — O23599 Infection of other part of genital tract in pregnancy, unspecified trimester: Secondary | ICD-10-CM

## 2015-09-11 LAB — CBC WITH DIFFERENTIAL/PLATELET
BASOS PCT: 0 %
Basophils Absolute: 0 10*3/uL (ref 0.0–0.1)
EOS ABS: 0.1 10*3/uL (ref 0.0–0.7)
EOS PCT: 1 %
HEMATOCRIT: 30.2 % — AB (ref 36.0–46.0)
HEMOGLOBIN: 9.8 g/dL — AB (ref 12.0–15.0)
LYMPHS PCT: 18 %
Lymphs Abs: 2.6 10*3/uL (ref 0.7–4.0)
MCH: 26.6 pg (ref 26.0–34.0)
MCHC: 32.5 g/dL (ref 30.0–36.0)
MCV: 82.1 fL (ref 78.0–100.0)
MONOS PCT: 5 %
Monocytes Absolute: 0.7 10*3/uL (ref 0.1–1.0)
NEUTROS ABS: 11.2 10*3/uL — AB (ref 1.7–7.7)
Neutrophils Relative %: 76 %
OTHER: 0 %
Platelets: 311 10*3/uL (ref 150–400)
RBC: 3.68 MIL/uL — ABNORMAL LOW (ref 3.87–5.11)
RDW: 13.9 % (ref 11.5–15.5)
WBC: 14.6 10*3/uL — ABNORMAL HIGH (ref 4.0–10.5)

## 2015-09-11 LAB — TYPE AND SCREEN
ABO/RH(D): B POS
Antibody Screen: NEGATIVE

## 2015-09-11 LAB — URINALYSIS, ROUTINE W REFLEX MICROSCOPIC
Bilirubin Urine: NEGATIVE
GLUCOSE, UA: NEGATIVE mg/dL
Ketones, ur: NEGATIVE mg/dL
Nitrite: NEGATIVE
PROTEIN: NEGATIVE mg/dL
pH: 6 (ref 5.0–8.0)

## 2015-09-11 LAB — URINE MICROSCOPIC-ADD ON

## 2015-09-11 LAB — ABO/RH: ABO/RH(D): B POS

## 2015-09-11 LAB — OB RESULTS CONSOLE GC/CHLAMYDIA: Gonorrhea: NEGATIVE

## 2015-09-11 MED ORDER — ACETAMINOPHEN 325 MG PO TABS: 650.0000 mg | ORAL_TABLET | ORAL | Status: DC | PRN

## 2015-09-11 MED ORDER — DOCUSATE SODIUM 100 MG PO CAPS
100.0000 mg | ORAL_CAPSULE | Freq: Every day | ORAL | Status: DC
  Administered 2015-09-12: 100 mg via ORAL
  Filled 2015-09-11: qty 1

## 2015-09-11 MED ORDER — NIFEDIPINE 10 MG PO CAPS
10.0000 mg | ORAL_CAPSULE | Freq: Four times a day (QID) | ORAL | Status: DC
  Administered 2015-09-11 – 2015-09-12 (×3): 10 mg via ORAL
  Filled 2015-09-11 (×3): qty 1

## 2015-09-11 MED ORDER — BETAMETHASONE SOD PHOS & ACET 6 (3-3) MG/ML IJ SUSP
12.0000 mg | Freq: Once | INTRAMUSCULAR | Status: AC
  Administered 2015-09-11: 12 mg via INTRAMUSCULAR
  Filled 2015-09-11: qty 2

## 2015-09-11 MED ORDER — NIFEDIPINE 10 MG PO CAPS
10.0000 mg | ORAL_CAPSULE | Freq: Once | ORAL | Status: AC
  Administered 2015-09-11: 10 mg via ORAL
  Filled 2015-09-11: qty 1

## 2015-09-11 MED ORDER — CALCIUM CARBONATE ANTACID 500 MG PO CHEW: 2.0000 | CHEWABLE_TABLET | ORAL | Status: DC | PRN

## 2015-09-11 MED ORDER — PRENATAL MULTIVITAMIN CH
1.0000 | ORAL_TABLET | Freq: Every day | ORAL | Status: DC
  Administered 2015-09-12: 1 via ORAL
  Filled 2015-09-11: qty 1

## 2015-09-11 MED ORDER — ZOLPIDEM TARTRATE 5 MG PO TABS: 5.0000 mg | ORAL_TABLET | Freq: Every evening | ORAL | Status: DC | PRN

## 2015-09-11 MED ORDER — LACTATED RINGERS IV BOLUS (SEPSIS)
1000.0000 mL | Freq: Once | INTRAVENOUS | Status: AC
  Administered 2015-09-11: 1000 mL via INTRAVENOUS

## 2015-09-11 MED ORDER — NIFEDIPINE 10 MG PO CAPS
20.0000 mg | ORAL_CAPSULE | Freq: Once | ORAL | Status: AC
  Administered 2015-09-11: 20 mg via ORAL
  Filled 2015-09-11: qty 2

## 2015-09-11 MED ORDER — ONDANSETRON 8 MG PO TBDP
8.0000 mg | ORAL_TABLET | Freq: Once | ORAL | Status: AC
  Administered 2015-09-11: 8 mg via ORAL
  Filled 2015-09-11: qty 1

## 2015-09-11 MED ORDER — BETAMETHASONE SOD PHOS & ACET 6 (3-3) MG/ML IJ SUSP
12.0000 mg | Freq: Once | INTRAMUSCULAR | Status: AC
  Administered 2015-09-12: 12 mg via INTRAMUSCULAR
  Filled 2015-09-11: qty 2

## 2015-09-11 MED ORDER — METRONIDAZOLE 500 MG PO TABS
500.0000 mg | ORAL_TABLET | Freq: Two times a day (BID) | ORAL | Status: DC
  Administered 2015-09-11 – 2015-09-12 (×2): 500 mg via ORAL
  Filled 2015-09-11 (×4): qty 1

## 2015-09-11 NOTE — MAU Note (Signed)
Pt states she was working and had a sudden onset of diarrhea and loss of mucous. Pt states she has intermittent cramping. + FM. Pt denies bleeding.

## 2015-09-11 NOTE — H&P (Signed)
History     CSN: [id number]  Arrival date and time: 09/11/15 1336   None        Chief Complaint  Patient presents with  . Diarrhea   Morgan Ramirez is a 25 y.o. G1P0 at [redacted]w[redacted]d presenting with lower abdominal cramping and states she had 1 episode of loose stools and passed mucous plug a couple of hours ago. Has not eaten since breakfast. Just completed a course of amoxicillin for strep bacteriuria. Seen here 10 days ago with pelvic pressure and lower abdominal pain which has continued intermittently especially when she is at work. Cervix exam at that time was 1 cm/ 50%.   Diarrhea   This is a new problem. The current episode started today (once. no blood or mucus). The problem occurs less than 2 times per day. The problem has been unchanged. Associated symptoms include abdominal pain. Pertinent negatives include no fever, headaches or vomiting. Associated symptoms comments: Mild cramps. Nothing aggravates the symptoms. There are no known risk factors. She has tried nothing for the symptoms.   Pregnancy course significant for third trimester entry to care, strep bacteruiria and BUFA. LMP unsure but is BEGA.        Past Medical History:  Diagnosis Date  . Chronic back pain          Past Surgical History:  Procedure Laterality Date  . NO PAST SURGERIES           Family History  Problem Relation Age of Onset  . Adopted: Yes  . Family history unknown: Yes        Social History  Substance Use Topics  . Smoking status: Never Smoker  . Smokeless tobacco: Never Used  . Alcohol use No    Allergies: No Known Allergies         Prescriptions Prior to Admission  Medication Sig Dispense Refill Last Dose  . calcium carbonate (TUMS - DOSED IN MG ELEMENTAL CALCIUM) 500 MG chewable tablet Chew 2 tablets by mouth as needed for indigestion or heartburn.   Past Week at Unknown time  . Prenatal Vit-Fe Fumarate-FA (MULTIVITAMIN-PRENATAL) 27-0.8 MG TABS tablet  Take 1 tablet by mouth daily at 12 noon.   09/11/2015 at Unknown time  . amoxicillin (AMOXIL) 500 MG capsule Take 1 capsule (500 mg total) by mouth 3 (three) times daily. (Patient not taking: Reported on 09/11/2015) 21 capsule 0 Completed Course at Unknown time    Review of Systems  Constitutional: Negative for fever.  Gastrointestinal: Positive for abdominal pain and diarrhea. Negative for blood in stool, constipation, nausea and vomiting.  Genitourinary: Negative for dysuria, frequency, hematuria and urgency.  Neurological: Negative for dizziness and headaches.   Physical Exam   Blood pressure 126/72, pulse 79, temperature 97.7 F (36.5 C), resp. rate 18, last menstrual period 01/11/2015.  Physical Exam  Constitutional: She is oriented to person, place, and time. She appears well-developed and well-nourished. No distress.  HENT:  Head: Normocephalic.  Eyes: Pupils are equal, round, and reactive to light.  Neck: Normal range of motion.  Cardiovascular: Normal rate.   Respiratory: Effort normal.  GI: Soft. There is no tenderness.  Genitourinary: No vaginal discharge found.  Genitourinary Comments: cx 1.5/75/-3 at 1434 Recheck at 1630: 1.5/ 90/-3, intact  Musculoskeletal: Normal range of motion.  Neurological: She is alert and oriented to person, place, and time.  Skin: Skin is warm and dry.   EFM: FHR 120-125, reactive Toco: UI with irregular UCs> UCs q1 1/2-3 min still  after second dose Procardia  MAU Course  Procedures LabResultsLast24Hours       Results for orders placed or performed during the hospital encounter of 09/11/15 (from the past 24 hour(s))  Urinalysis, Routine w reflex microscopic (not at [facility])     Status: Abnormal   Collection Time: 09/11/15  1:44 PM  Result Value Ref Range   Color, Urine YELLOW YELLOW   APPearance CLEAR CLEAR   Specific Gravity, Urine <1.005 (L) 1.005 - 1.030   pH 6.0 5.0 - 8.0   Glucose, UA NEGATIVE NEGATIVE mg/dL    Hgb urine dipstick TRACE (A) NEGATIVE   Bilirubin Urine NEGATIVE NEGATIVE   Ketones, ur NEGATIVE NEGATIVE mg/dL   Protein, ur NEGATIVE NEGATIVE mg/dL   Nitrite NEGATIVE NEGATIVE   Leukocytes, UA LARGE (A) NEGATIVE  Urine microscopic-add on     Status: Abnormal   Collection Time: 09/11/15  1:44 PM  Result Value Ref Range   Squamous Epithelial / LPF 0-5 (A) NONE SEEN   WBC, UA 6-30 0 - 5 WBC/hpf   RBC / HPF 0-5 0 - 5 RBC/hpf   Bacteria, UA FEW (A) NONE SEEN   Urine-Other TRICHOMONAS PRESENT       Procardia  po x 2  C/W Dr. Emelda Fear re: uncertain dating criteria and plan to give BMZ> will give BMZ and continue observation with cx recheck   Assessment and Plan  G1 at [redacted]w[redacted]d BEGA Threatened PTL Trichomonas infection Plan: admit to obs, GC, [facility], Flagyl to tx trich. Tocolytics. Consulted Dr. Emelda Fear regarding POC

## 2015-09-11 NOTE — MAU Provider Note (Signed)
History     CSN: [id number]  Arrival date and time: 09/11/15 1336   None     Chief Complaint  Patient presents with  . Diarrhea   Morgan Ramirez is a 25 y.o. G1P0 at [redacted]w[redacted]d presenting with lower abdominal cramping and states she had 1 episode of loose stools and passed mucous plug a couple of hours ago. Has not eaten since breakfast. Just completed a course of amoxicillin for strep bacteriuria. Seen here 10 days ago with pelvic pressure and lower abdominal pain which has continued intermittently especially when she is at work. Cervix exam at that time was 1 cm/ 50%.    Diarrhea   This is a new problem. The current episode started today (once. no blood or mucus). The problem occurs less than 2 times per day. The problem has been unchanged. Associated symptoms include abdominal pain. Pertinent negatives include no fever, headaches or vomiting. Associated symptoms comments: Mild cramps. Nothing aggravates the symptoms. There are no known risk factors. She has tried nothing for the symptoms.   Pregnancy course significant for third trimester entry to care, strep bacteruiria and BUFA. LMP unsure but is BEGA.    Past Medical History:  Diagnosis Date  . Chronic back pain     Past Surgical History:  Procedure Laterality Date  . NO PAST SURGERIES      Family History  Problem Relation Age of Onset  . Adopted: Yes  . Family history unknown: Yes    Social History  Substance Use Topics  . Smoking status: Never Smoker  . Smokeless tobacco: Never Used  . Alcohol use No    Allergies: No Known Allergies  Prescriptions Prior to Admission  Medication Sig Dispense Refill Last Dose  . calcium carbonate (TUMS - DOSED IN MG ELEMENTAL CALCIUM) 500 MG chewable tablet Chew 2 tablets by mouth as needed for indigestion or heartburn.   Past Week at Unknown time  . Prenatal Vit-Fe Fumarate-FA (MULTIVITAMIN-PRENATAL) 27-0.8 MG TABS tablet Take 1 tablet by mouth daily at 12 noon.   09/11/2015 at  Unknown time  . amoxicillin (AMOXIL) 500 MG capsule Take 1 capsule (500 mg total) by mouth 3 (three) times daily. (Patient not taking: Reported on 09/11/2015) 21 capsule 0 Completed Course at Unknown time    Review of Systems  Constitutional: Negative for fever.  Gastrointestinal: Positive for abdominal pain and diarrhea. Negative for blood in stool, constipation, nausea and vomiting.  Genitourinary: Negative for dysuria, frequency, hematuria and urgency.  Neurological: Negative for dizziness and headaches.   Physical Exam   Blood pressure 126/72, pulse 79, temperature 97.7 F (36.5 C), resp. rate 18, last menstrual period 01/11/2015.  Physical Exam  Constitutional: She is oriented to person, place, and time. She appears well-developed and well-nourished. No distress.  HENT:  Head: Normocephalic.  Eyes: Pupils are equal, round, and reactive to light.  Neck: Normal range of motion.  Cardiovascular: Normal rate.   Respiratory: Effort normal.  GI: Soft. There is no tenderness.  Genitourinary: No vaginal discharge found.  Genitourinary Comments: cx 1.5/75/-3 at 1434 Recheck at 1630: 1.5/ 90/-3, intact  Musculoskeletal: Normal range of motion.  Neurological: She is alert and oriented to person, place, and time.  Skin: Skin is warm and dry.   EFM: FHR 120-125, reactive Toco: UI with irregular UCs> UCs q1 1/2-3 min still after second dose Procardia  MAU Course  Procedures Results for orders placed or performed during the hospital encounter of 09/11/15 (from the past 24 hour(s))  Urinalysis, Routine w reflex microscopic (not at [facility])     Status: Abnormal   Collection Time: 09/11/15  1:44 PM  Result Value Ref Range   Color, Urine YELLOW YELLOW   APPearance CLEAR CLEAR   Specific Gravity, Urine <1.005 (L) 1.005 - 1.030   pH 6.0 5.0 - 8.0   Glucose, UA NEGATIVE NEGATIVE mg/dL   Hgb urine dipstick TRACE (A) NEGATIVE   Bilirubin Urine NEGATIVE NEGATIVE   Ketones, ur NEGATIVE NEGATIVE  mg/dL   Protein, ur NEGATIVE NEGATIVE mg/dL   Nitrite NEGATIVE NEGATIVE   Leukocytes, UA LARGE (A) NEGATIVE  Urine microscopic-add on     Status: Abnormal   Collection Time: 09/11/15  1:44 PM  Result Value Ref Range   Squamous Epithelial / LPF 0-5 (A) NONE SEEN   WBC, UA 6-30 0 - 5 WBC/hpf   RBC / HPF 0-5 0 - 5 RBC/hpf   Bacteria, UA FEW (A) NONE SEEN   Urine-Other TRICHOMONAS PRESENT     Procardia  po x 2  C/W Dr. Emelda Fear re: uncertain dating criteria and plan to give BMZ> will give BMZ and continue observation with cx recheck   Assessment and Plan  G1 at [redacted]w[redacted]d BEGA Threatened PTL Trichomonas infection  1650: Care assumed by Zerita Boers CNM  [name] 09/11/2015, 2:31 PM

## 2015-09-12 DIAGNOSIS — Z3A35 35 weeks gestation of pregnancy: Secondary | ICD-10-CM

## 2015-09-12 DIAGNOSIS — O4703 False labor before 37 completed weeks of gestation, third trimester: Secondary | ICD-10-CM

## 2015-09-12 LAB — GC/CHLAMYDIA PROBE AMP (~~LOC~~) NOT AT ARMC
CHLAMYDIA, DNA PROBE: NEGATIVE
Neisseria Gonorrhea: NEGATIVE

## 2015-09-12 MED ORDER — NIFEDIPINE ER OSMOTIC RELEASE 30 MG PO TB24: 30.0000 mg | ORAL_TABLET | Freq: Every day | ORAL | 0 refills | Status: DC

## 2015-09-12 NOTE — Clinical SW OB High Risk (Signed)
Clinical Social Work Antenatal   Clinical Social Worker:  Marshell Garfinkel Date/Time:  09/12/2015, 11:09 AM Gestational Age on Admission:  25 y.o. Admitting Diagnosis:  Diaherra   Expected Delivery Date:  10/18/15  Family/Home Environment  Home Address:  [place]  Household Member/Support Name:  Patient lives alone Relationship:  Friend Other Support:  Engineering geologist  (co-worker)   Psychosocial Data  Information Source:  Patient Interview Resources:  [name] plans to place baby up for adoption through a lawyer:  Eli Phillips   Employment:  Works full time at [employer] on [employer] [facility]):  [place]:  [place]   Current Grade:  none  Homebound Arranged: No  Other Resources:  Personal assistant Care:  None reported   Strengths/Weaknesses/Factors to Consider  Concerns Related to Hospitalization:  [name] is admitted for [place] and under observation status.  No concerns at this time other than discussing plans for adoption and current emotional status.   Previous Pregnancies/Feelings Towards Pregnancy?  Concerns related to being/becoming a mother?:  [name] reports she has come to terms with adoption and plans to NOT see the baby, do skin to skin, and has picked a family to adopt her child.  She reports she cannot care for a child nor has the support at this time to adequately care for the baby.   [name] reports FOB has not been involved and none of his family is aware of her or the fact she is pregnant.  She is very specific about how she wants labor and delivery to happen when she does have the baby. She feels no contact will help make the adoption easier.  She reports she did not accept abortion as she and FOB did make this baby and taking responsibility for their actions.  Social Support (FOB? Who is/will be helping with baby/other kids?): [name] has one support person who has been helping her through decision  making and adoption. A co-worker named Forensic psychologist.  Reports she will be the person in the delivery room or the adoptive parent: Baldo Ash.     Couples Relationship (describe): No relationship and the are not together. FOB has been agreeable to plan for adoption and signed paperwork.   Recent Stressful Life Events (life changes in past year?):  Other than pregnancy, none.  Reports she has just started prenatal care as it did not seem real and this has been stressful with planning.   Prenatal Care/Education/Home Preparations: None.  Initial prenatal completed at 31 weeks.   Domestic Violence (of any type):  No If Yes to Domestic Violence, Describe/Action Plan:   NA   Substance Use During Pregnancy: No (If Yes, Complete SBIRT)  Complete PHQ-9 (Depresssion Screening) on all Antenatal Patients PHQ-9 Score (If Score => 15 complete TREAT):  NA   Follow-up Recommendations:  LCSW will assist with [name] getting some counseling after baby and she is agreeable to plan.   Patient Advised/Response:   Open and understanding regarding processing emotions after having baby and giving baby up for adoption.  LCSW ask [name] to formally write her birth plan as this needs to be communicated to staff in effort to meet her needs and desires for baby.   Other:   NA  Clinical Assessment/Plan:   LCSW met with [name] at the bedside. She was alone and agreeable to assessment. [name] reports she is considering adoption and has a plan in place through an attorney.  She reports she just  cannot take care of a baby right now and has no support as her mother and father have passed away and has no other grandparents or family members she would feel comfortable letting the baby live and grow with. She was introduced to a family who could not have a baby and decided to work with a Chief Executive Officer in effort to complete adoption.  [name] is very specific and communicates not wanting to see the baby, do skin to skin, and wants adoptive parents to  know the baby and baby to know them with smell.  She processes how she does not want to have an emotional attachment with regards to seeing the baby.  [name] is very mature and open about her situation. Appreciative of information and LCSW left contact information with regards to reaching LCSW if needs or questions arise prior to birth or when she delivers.

## 2015-09-12 NOTE — Discharge Instructions (Signed)

## 2015-09-12 NOTE — Discharge Summary (Signed)
Antenatal Physician Discharge Summary  Patient ID: Morgan Ramirez MRN: [id number] DOB/AGE: 24-24-1992 23 y.o.  Admit date: 09/11/2015 Discharge date: 09/12/2015  Admission Diagnoses:Principal Problem:   Threatened preterm labor Active Problems:   Pregnancy with adoption planned, antepartum   Trichomonal vaginitis during pregnancy, antepartum  Discharge Diagnoses: Same  Prenatal Procedures: NST  Consults: None  Hospital Course:  This is a 25 y.o. G1P0 with IUP at [redacted]w[redacted]d admitted for threatened PTL with minimal cervical change. She was admitted with contractions, noted to have a cervical exam of 1-2/90/-2.  No leaking of fluid and no bleeding.  She was initially started on Procardia for tocolysis and also received betamethasone x 2 doses.    She was observed, fetal heart rate monitoring remained reassuring, and she had no signs/symptoms of progressing preterm labor or other maternal-fetal concerns. She was noted to have trich and was treated for this. Her cervical exam was unchanged from admission and actually improved to 1-2/60/-2.  She was deemed stable for discharge to home with outpatient follow up.  Discharge Exam: Temp:  [97.7 F (36.5 C)-98.2 F (36.8 C)] 97.7 F (36.5 C) (07/24 0730) Pulse Rate:  [79-95] 89 (07/24 0612) Resp:  [15-20] 20 (07/24 0730) BP: (114-126)/(58-73) 120/64 (07/24 0612) Weight:  [145 lb (65.8 kg)] 145 lb (65.8 kg) (07/23 1802) Physical Examination: CONSTITUTIONAL: Well-developed, well-nourished female in no acute distress.  HENT:  Normocephalic, atraumatic, External right and left ear normal. Oropharynx is clear and moist EYES: Conjunctivae and EOM are normal. Pupils are equal, round, and reactive to light. No scleral icterus.  NECK: Normal range of motion, supple, no masses SKIN: Skin is warm and dry. No rash noted. Not diaphoretic. No erythema. No pallor. NEUROLGIC: Alert and oriented to person, place, and time. Normal reflexes, muscle tone  coordination. No cranial nerve deficit noted. PSYCHIATRIC: Normal mood and affect. Normal behavior. Normal judgment and thought content. CARDIOVASCULAR: Normal heart rate noted, regular rhythm RESPIRATORY: Effort and breath sounds normal, no problems with respiration noted MUSCULOSKELETAL: Normal range of motion. No edema and no tenderness. 2+ distal pulses. ABDOMEN: Soft, nontender, nondistended, gravid. CERVIX: Dilation: 1.5 Effacement (%): 60 Cervical Position: Posterior Station: -2 Presentation: Vertex Exam by:: Dr. Shawnie Pons  Fetal monitoring: FHR: 135 bpm, Variability: moderate, Accelerations: Present, Decelerations: Absent  Uterine activity: 4 contractions per hour with irritability  Significant Diagnostic Studies:  Results for orders placed or performed during the hospital encounter of 09/11/15 (from the past 168 hour(s))  Urinalysis, Routine w reflex microscopic (not at [facility])   Collection Time: 09/11/15  1:44 PM  Result Value Ref Range   Color, Urine YELLOW YELLOW   APPearance CLEAR CLEAR   Specific Gravity, Urine <1.005 (L) 1.005 - 1.030   pH 6.0 5.0 - 8.0   Glucose, UA NEGATIVE NEGATIVE mg/dL   Hgb urine dipstick TRACE (A) NEGATIVE   Bilirubin Urine NEGATIVE NEGATIVE   Ketones, ur NEGATIVE NEGATIVE mg/dL   Protein, ur NEGATIVE NEGATIVE mg/dL   Nitrite NEGATIVE NEGATIVE   Leukocytes, UA LARGE (A) NEGATIVE  Urine microscopic-add on   Collection Time: 09/11/15  1:44 PM  Result Value Ref Range   Squamous Epithelial / LPF 0-5 (A) NONE SEEN   WBC, UA 6-30 0 - 5 WBC/hpf   RBC / HPF 0-5 0 - 5 RBC/hpf   Bacteria, UA FEW (A) NONE SEEN   Urine-Other TRICHOMONAS PRESENT   CBC with Differential   Collection Time: 09/11/15  2:22 PM  Result Value Ref Range   WBC 14.6 (H)  4.0 - 10.5 K/uL   RBC 3.68 (L) 3.87 - 5.11 MIL/uL   Hemoglobin 9.8 (L) 12.0 - 15.0 g/dL   HCT 96.0 (L) 45.4 - 09.8 %   MCV 82.1 78.0 - 100.0 fL   MCH 26.6 26.0 - 34.0 pg   MCHC 32.5 30.0 - 36.0 g/dL   RDW  11.9 14.7 - 82.9 %   Platelets 311 150 - 400 K/uL   Neutrophils Relative % 76 %   Lymphocytes Relative 18 %   Monocytes Relative 5 %   Eosinophils Relative 1 %   Basophils Relative 0 %   Other 0 %   Neutro Abs 11.2 (H) 1.7 - 7.7 K/uL   Lymphs Abs 2.6 0.7 - 4.0 K/uL   Monocytes Absolute 0.7 0.1 - 1.0 K/uL   Eosinophils Absolute 0.1 0.0 - 0.7 K/uL   Basophils Absolute 0.0 0.0 - 0.1 K/uL  Type and screen [facility]   Collection Time: 09/11/15  2:22 PM  Result Value Ref Range   ABO/RH(D) B POS    Antibody Screen NEG    Sample Expiration 09/14/2015   ABO/Rh   Collection Time: 09/11/15  2:22 PM  Result Value Ref Range   ABO/RH(D) B POS     Discharge Condition: Stable  Disposition: 01-Home or Self Care   Discharge Instructions    Discharge activity:  Bathroom / Shower only    Complete by:  As directed   Discharge activity: Bedrest    Complete by:  As directed   Discharge diet:  No restrictions    Complete by:  As directed   Do not have sex or do anything that might make you have an orgasm    Complete by:  As directed   Fetal Kick Count:  Lie on our left side for one hour after a meal, and count the number of times your baby kicks.  If it is less than 5 times, get up, move around and drink some juice.  Repeat the test 30 minutes later.  If it is still less than 5 kicks in an hour, notify your doctor.    Complete by:  As directed   Notify physician for a general feeling that "something is not right"    Complete by:  As directed   Notify physician for increase or change in vaginal discharge    Complete by:  As directed   Notify physician for intestinal cramps, with or without diarrhea, sometimes described as "gas pain"    Complete by:  As directed   Notify physician for leaking of fluid    Complete by:  As directed   Notify physician for low, dull backache, unrelieved by heat or Tylenol    Complete by:  As directed   Notify physician for menstrual like cramps     Complete by:  As directed   Notify physician for pelvic pressure    Complete by:  As directed   Notify physician for uterine contractions.  These may be painless and feel like the uterus is tightening or the baby is  "balling up"    Complete by:  As directed   Notify physician for vaginal bleeding    Complete by:  As directed   PRETERM LABOR:  Includes any of the follwing symptoms that occur between 20 - [redacted] weeks gestation.  If these symptoms are not stopped, preterm labor can result in preterm delivery, placing your baby at risk    Complete by:  As directed  Medication List    STOP taking these medications   amoxicillin 500 MG capsule Commonly known as:  AMOXIL     TAKE these medications   calcium carbonate 500 MG chewable tablet Commonly known as:  TUMS - dosed in mg elemental calcium Chew 2 tablets by mouth as needed for indigestion or heartburn.   multivitamin-prenatal 27-0.8 MG Tabs tablet Take 1 tablet by mouth daily at 12 noon.   NIFEdipine 30 MG 24 hr tablet Commonly known as:  PROCARDIA-XL/ADALAT-CC/NIFEDICAL-XL Take 1 tablet (30 mg total) by mouth daily.      Follow-up Information    [facility] up in 10 day(s).   Specialty:  Obstetrics and Gynecology Why:  Keep your next scheduled appointment Contact information: [address] [place] [address] [phone]          Signed: Reva Bores M.D. 09/12/2015, 10:43 AM

## 2015-09-19 ENCOUNTER — Encounter (HOSPITAL_COMMUNITY): Payer: Self-pay

## 2015-09-19 ENCOUNTER — Inpatient Hospital Stay (HOSPITAL_COMMUNITY)
Admission: AD | Admit: 2015-09-19 | Discharge: 2015-09-19 | Disposition: A | Discharge status: Home / Self Care | Payer: Medicaid Other | Source: Ambulatory Visit | Attending: Obstetrics and Gynecology | Admitting: Obstetrics and Gynecology

## 2015-09-19 DIAGNOSIS — Z3A35 35 weeks gestation of pregnancy: Secondary | ICD-10-CM | POA: Diagnosis not present

## 2015-09-19 DIAGNOSIS — O2343 Unspecified infection of urinary tract in pregnancy, third trimester: Secondary | ICD-10-CM | POA: Diagnosis not present

## 2015-09-19 DIAGNOSIS — B951 Streptococcus, group B, as the cause of diseases classified elsewhere: Secondary | ICD-10-CM | POA: Diagnosis not present

## 2015-09-19 DIAGNOSIS — Z3483 Encounter for supervision of other normal pregnancy, third trimester: Secondary | ICD-10-CM

## 2015-09-19 DIAGNOSIS — R102 Pelvic and perineal pain: Secondary | ICD-10-CM | POA: Diagnosis present

## 2015-09-19 DIAGNOSIS — O4703 False labor before 37 completed weeks of gestation, third trimester: Secondary | ICD-10-CM | POA: Diagnosis not present

## 2015-09-19 DIAGNOSIS — O0933 Supervision of pregnancy with insufficient antenatal care, third trimester: Secondary | ICD-10-CM | POA: Diagnosis not present

## 2015-09-19 DIAGNOSIS — D649 Anemia, unspecified: Secondary | ICD-10-CM | POA: Insufficient documentation

## 2015-09-19 DIAGNOSIS — Z3490 Encounter for supervision of normal pregnancy, unspecified, unspecified trimester: Secondary | ICD-10-CM

## 2015-09-19 DIAGNOSIS — O99013 Anemia complicating pregnancy, third trimester: Secondary | ICD-10-CM | POA: Diagnosis not present

## 2015-09-19 HISTORY — DX: Trichomoniasis, unspecified: A59.9

## 2015-09-19 LAB — URINALYSIS, ROUTINE W REFLEX MICROSCOPIC
Bilirubin Urine: NEGATIVE
GLUCOSE, UA: NEGATIVE mg/dL
KETONES UR: NEGATIVE mg/dL
Nitrite: NEGATIVE
PROTEIN: NEGATIVE mg/dL
Specific Gravity, Urine: <1.005 — ABNORMAL LOW (ref 1.005–1.030)
pH: 6.5 (ref 5.0–8.0)

## 2015-09-19 LAB — URINE MICROSCOPIC-ADD ON: RBC / HPF: NONE SEEN RBC/hpf (ref 0–5)

## 2015-09-19 MED ORDER — TRAMADOL HCL 50 MG PO TABS
50.0000 mg | ORAL_TABLET | Freq: Once | ORAL | Status: AC
Start: 2015-09-19 — End: 2015-09-19
  Administered 2015-09-19: 50 mg via ORAL
  Filled 2015-09-19: qty 1

## 2015-09-19 NOTE — MAU Note (Signed)
Been having back pain, became worse today.  Also having pelvic pain. Was given BMZ last wk. ? Contractions.

## 2015-09-19 NOTE — MAU Provider Note (Signed)
Chief Complaint:  Back Pain and Pelvic Pain   First Provider Initiated Contact with Patient 09/19/15 1903     HPI  Morgan Ramirez is a 25 y.o. G1P0 at 26w6dwho presents to maternity admissions reporting contractions off and on all day which got more painful this afternoon.  She reports good fetal movement, denies LOF, vaginal bleeding, vaginal itching/burning, urinary symptoms, h/a, dizziness, n/v, diarrhea, constipation or fever/chills.  She denies headache, visual changes or RUQ abdominal pain. Took Procardia this morning as ordered  Has an appointment Thursday.  RN Note: Been having back pain, became worse today.  Also having pelvic pain. Was given BMZ last wk. ? Contractions  Past Medical History: Past Medical History:  Diagnosis Date  . Chronic back pain   . Trichimoniasis     Past obstetric history: OB History  Gravida Para Term Preterm AB Living  1            SAB TAB Ectopic Multiple Live Births               # Outcome Date GA Lbr Len/2nd Weight Sex Delivery Anes PTL Lv  1 Current               Past Surgical History: Past Surgical History:  Procedure Laterality Date  . NO PAST SURGERIES      Family History: Family History  Problem Relation Age of Onset  . Adopted: Yes  . Family history unknown: Yes    Social History: Social History  Substance Use Topics  . Smoking status: Never Smoker  . Smokeless tobacco: Never Used  . Alcohol use No    Allergies: No Known Allergies  Meds:  Prescriptions Prior to Admission  Medication Sig Dispense Refill Last Dose  . calcium carbonate (TUMS - DOSED IN MG ELEMENTAL CALCIUM) 500 MG chewable tablet Chew 2 tablets by mouth as needed for indigestion or heartburn.   Past Week at Unknown time  . NIFEdipine (PROCARDIA-XL/ADALAT-CC/NIFEDICAL-XL) 30 MG 24 hr tablet Take 1 tablet (30 mg total) by mouth daily. 16 tablet 0   . Prenatal Vit-Fe Fumarate-FA (MULTIVITAMIN-PRENATAL) 27-0.8 MG TABS tablet Take 1 tablet by mouth daily  at 12 noon.   09/11/2015 at Unknown time    I have reviewed patient's Past Medical Hx, Surgical Hx, Family Hx, Social Hx, medications and allergies.   ROS:  Review of Systems  Constitutional: Negative for chills and fever.  Gastrointestinal: Positive for abdominal pain. Negative for constipation, diarrhea, nausea and vomiting.  Genitourinary: Positive for pelvic pain. Negative for vaginal bleeding and vaginal discharge.  Musculoskeletal: Positive for back pain.  Neurological: Negative for dizziness.   Other systems negative  Physical Exam  Patient Vitals for the past 24 hrs:  BP Temp Temp src Pulse Resp Height Weight  09/19/15 1851 - - - - -  (1.6 m) 141 lb (64 kg)  09/19/15 1833 125/81 98 F (36.7 C) Oral 82 18 - 141 lb 12.8 oz (64.3 kg)   Constitutional: Well-developed, well-nourished female in no acute distress.  Cardiovascular: normal rate and rhythm Respiratory: normal effort, clear to auscultation bilaterally GI: Abd soft, non-tender, gravid appropriate for gestational age.   No rebound or guarding. MS: Extremities nontender, no edema, normal ROM Neurologic: Alert and oriented x 4.  GU: Neg CVAT.   Cervix 1/long/-3/vertex    FHT:  Baseline 140 , moderate variability, accelerations present, no decelerations Contractions: Irregular    Labs: No results found for this or any previous visit (from the  past 24 hour(s)). --/--/B POS, B POS (07/23 1422)  Imaging:  [place] Mfm Ob Comp + 14 Wk  Result Date: 08/31/2015 OBSTETRICAL ULTRASOUND: This exam was performed within a [facility]. The OB US report was generated in the AS system, and faxed to the ordering physician.  This report is available in the [employer]. See the [facility] Obstetric US report via the Image Link.   MAU Course/MDM:  NST reviewed  Treatments in MAU included Tramadol for pain (refused Percocet).    Observed for a while with no change in labor pattern.   Assessment: 1. GBS (group  B streptococcus) UTI complicating pregnancy, third trimester   2. Anemia of mother in pregnancy, antepartum, third trimester   3. Supervision of normal pregnancy, antepartum, third trimester   4. Late prenatal care affecting pregnancy, antepartum, third trimester   5. Pregnancy with adoption planned, antepartum   6.   Preterm contractions with no cervical change  Plan: Discharge home Preterm Labor precautions and fetal kick counts Continue Procardia as ordered Follow up in Office for prenatal visits and recheck    Medication List    ASK your doctor about these medications   calcium carbonate 500 MG chewable tablet Commonly known as:  TUMS - dosed in mg elemental calcium Chew 2 tablets by mouth as needed for indigestion or heartburn.   multivitamin-prenatal 27-0.8 MG Tabs tablet Take 1 tablet by mouth daily at 12 noon.   NIFEdipine 30 MG 24 hr tablet Commonly known as:  PROCARDIA-XL/ADALAT-CC/NIFEDICAL-XL Take 1 tablet (30 mg total) by mouth daily.      Pt stable at time of discharge. Encouraged to return here or to other Urgent Care/ED if she develops worsening of symptoms, increase in pain, fever, or other concerning symptoms.      Wynelle Bourgeois CNM, MSN Certified Nurse-Midwife 09/19/2015 7:03 PM

## 2015-09-19 NOTE — Discharge Instructions (Signed)

## 2015-09-20 ENCOUNTER — Telehealth: Payer: Self-pay | Admitting: *Deleted

## 2015-09-20 ENCOUNTER — Other Ambulatory Visit: Payer: Self-pay | Admitting: *Deleted

## 2015-09-20 MED ORDER — METRONIDAZOLE 500 MG PO TABS: ORAL_TABLET | ORAL | 0 refills | Status: DC

## 2015-09-20 NOTE — Telephone Encounter (Signed)
Patient returned call for test results. Told patient her test was positive for trich and an antibiotic has been called to her pharmacy. Also told her to notify her partner so he could be treated as well. Patient voiced understanding.

## 2015-09-20 NOTE — Telephone Encounter (Signed)
Received message from Philipp Deputy, CNM via inbasket that pt was seen @ MAU yesterday and needs Tx for Trich in her urine. Pt's partner will also need treatment. I called pt and left message to call us back regarding test results. Rx has been e-prescribed to her pharmacy.

## 2015-09-22 ENCOUNTER — Ambulatory Visit (INDEPENDENT_AMBULATORY_CARE_PROVIDER_SITE_OTHER): Payer: Medicaid Other | Admitting: Obstetrics & Gynecology

## 2015-09-22 VITALS — BP 120/80 | HR 77 | Wt 138.5 lb

## 2015-09-22 DIAGNOSIS — O0933 Supervision of pregnancy with insufficient antenatal care, third trimester: Secondary | ICD-10-CM | POA: Diagnosis present

## 2015-09-22 DIAGNOSIS — Z3493 Encounter for supervision of normal pregnancy, unspecified, third trimester: Secondary | ICD-10-CM

## 2015-09-22 DIAGNOSIS — Z331 Pregnant state, incidental: Secondary | ICD-10-CM

## 2015-09-22 LAB — POCT URINALYSIS DIP (DEVICE)
Bilirubin Urine: NEGATIVE
Glucose, UA: NEGATIVE mg/dL
Hgb urine dipstick: NEGATIVE
Ketones, ur: NEGATIVE mg/dL
Nitrite: NEGATIVE
Protein, ur: NEGATIVE mg/dL
Specific Gravity, Urine: 1.015 (ref 1.005–1.030)
Urobilinogen, UA: 0.2 mg/dL (ref 0.0–1.0)
pH: 6.5 (ref 5.0–8.0)

## 2015-09-22 LAB — OB RESULTS CONSOLE GBS: GBS: POSITIVE

## 2015-09-22 MED ORDER — METRONIDAZOLE 500 MG PO TABS: ORAL_TABLET | ORAL | 0 refills | Status: DC

## 2015-09-22 NOTE — Patient Instructions (Signed)

## 2015-09-22 NOTE — Progress Notes (Signed)
Subjective:  Morgan Ramirez is a 25 y.o. G1P0 at [redacted]w[redacted]d being seen today for ongoing prenatal care.  She is currently monitored for the following issues for this low-risk pregnancy and has Supervision of normal pregnancy, antepartum; Late prenatal care affecting pregnancy, antepartum; Pregnancy with adoption planned, antepartum; Anemia of mother in pregnancy, antepartum; GBS (group B streptococcus) UTI complicating pregnancy; Threatened preterm labor; and Trichomonal vaginitis during pregnancy, antepartum on her problem list.  Patient reports no complaints.   .  .  Movement: Present. Denies leaking of fluid.   The following portions of the patient's history were reviewed and updated as appropriate: allergies, current medications, past family history, past medical history, past social history, past surgical history and problem list. Problem list updated.  Objective:   Vitals:   09/22/15 0809  BP: 120/80  Pulse: 77  Weight: 138 lb 8 oz (62.8 kg)    Fetal Status: Fetal Heart Rate (bpm): 141   Movement: Present     General:  Alert, oriented and cooperative. Patient is in no acute distress.  Skin: Skin is warm and dry. No rash noted.   Cardiovascular: Normal heart rate noted  Respiratory: Normal respiratory effort, no problems with respiration noted  Abdomen: Soft, gravid, appropriate for gestational age. Pain/Pressure: Present     Pelvic:  Cervical exam performed        Extremities: Normal range of motion.  Edema: None  Mental Status: Normal mood and affect. Normal behavior. Normal judgment and thought content.   Urinalysis:      Assessment and Plan:  Pregnancy: G1P0 at [redacted]w[redacted]d  There are no diagnoses linked to this encounter. Preterm labor symptoms and general obstetric precautions including but not limited to vaginal bleeding, contractions, leaking of fluid and fetal movement were reviewed in detail with the patient. Please refer to After Visit Summary for other counseling  recommendations.  No Follow-up on file.   Adam Phenix, MD

## 2015-09-23 LAB — CULTURE, BETA STREP (GROUP B ONLY)

## 2015-09-29 ENCOUNTER — Ambulatory Visit (INDEPENDENT_AMBULATORY_CARE_PROVIDER_SITE_OTHER): Payer: Medicaid Other | Admitting: Family Medicine

## 2015-09-29 VITALS — BP 125/78 | HR 78 | Wt 143.2 lb

## 2015-09-29 DIAGNOSIS — D649 Anemia, unspecified: Secondary | ICD-10-CM

## 2015-09-29 DIAGNOSIS — A5901 Trichomonal vulvovaginitis: Secondary | ICD-10-CM

## 2015-09-29 DIAGNOSIS — Z3490 Encounter for supervision of normal pregnancy, unspecified, unspecified trimester: Secondary | ICD-10-CM

## 2015-09-29 DIAGNOSIS — Z3483 Encounter for supervision of other normal pregnancy, third trimester: Secondary | ICD-10-CM

## 2015-09-29 DIAGNOSIS — O23593 Infection of other part of genital tract in pregnancy, third trimester: Secondary | ICD-10-CM

## 2015-09-29 DIAGNOSIS — O99013 Anemia complicating pregnancy, third trimester: Secondary | ICD-10-CM

## 2015-09-29 DIAGNOSIS — O0933 Supervision of pregnancy with insufficient antenatal care, third trimester: Secondary | ICD-10-CM

## 2015-09-29 LAB — POCT URINALYSIS DIP (DEVICE)
Bilirubin Urine: NEGATIVE
Glucose, UA: NEGATIVE mg/dL
HGB URINE DIPSTICK: NEGATIVE
Ketones, ur: NEGATIVE mg/dL
NITRITE: NEGATIVE
PH: 7 (ref 5.0–8.0)
PROTEIN: NEGATIVE mg/dL
SPECIFIC GRAVITY, URINE: 1.01 (ref 1.005–1.030)
UROBILINOGEN UA: 0.2 mg/dL (ref 0.0–1.0)

## 2015-09-29 NOTE — Progress Notes (Signed)
Subjective:  Morgan Ramirez is a 25 y.o. G1P0 at 7128w2d being seen today for ongoing prenatal care.  She is currently monitored for the following issues for this low-risk pregnancy and has Supervision of normal pregnancy, antepartum; Late prenatal care affecting pregnancy, antepartum; Pregnancy with adoption planned, antepartum; Anemia of mother in pregnancy, antepartum; GBS (group B streptococcus) UTI complicating pregnancy; Threatened preterm labor; and Trichomonal vaginitis during pregnancy, antepartum on her problem list.  Patient reports occasional contractions.  Contractions: Irritability. Vag. Bleeding: None.  Movement: Present. Denies leaking of fluid.   The following portions of the patient's history were reviewed and updated as appropriate: allergies, current medications, past family history, past medical history, past social history, past surgical history and problem list. Problem list updated.  Objective:   Vitals:   09/29/15 1536  BP: 125/78  Pulse: 78  Weight: 143 lb 3.2 oz (65 kg)    Fetal Status: Fetal Heart Rate (bpm): 135   Movement: Present     General:  Alert, oriented and cooperative. Patient is in no acute distress.  Skin: Skin is warm and dry. No rash noted.   Cardiovascular: Normal heart rate noted  Respiratory: Normal respiratory effort, no problems with respiration noted  Abdomen: Soft, gravid, appropriate for gestational age. Pain/Pressure: Present     Pelvic:  Cervical exam performed        Extremities: Normal range of motion.  Edema: Trace  Mental Status: Normal mood and affect. Normal behavior. Normal judgment and thought content.   Urinalysis:      Assessment and Plan:  Pregnancy: G1P0 at 3228w2d  1. Supervision of normal pregnancy, antepartum, third trimester  2. Late prenatal care affecting pregnancy, antepartum, third trimester  3. Pregnancy with adoption planned, antepartum - SW to be contacted at delivery  4. Anemia of mother in pregnancy,  antepartum, third trimester  5. Trichomonal vaginitis during pregnancy, antepartum, third trimester - Urinalysis  6. GBS bacturia - Treat with PCN in labor   Term labor symptoms and general obstetric precautions including but not limited to vaginal bleeding, contractions, leaking of fluid and fetal movement were reviewed in detail with the patient. Please refer to After Visit Summary for other counseling recommendations.  Return in about 1 week (around 10/06/2015) for Routine OB visit.   [facility] Vascular CenterElizabeth Woodland GarnerMumaw, [place]DO

## 2015-09-29 NOTE — Patient Instructions (Signed)

## 2015-09-30 LAB — URINALYSIS
Bilirubin Urine: NEGATIVE
Glucose, UA: NEGATIVE
HGB URINE DIPSTICK: NEGATIVE
Ketones, ur: NEGATIVE
NITRITE: NEGATIVE
PH: 7 (ref 5.0–8.0)
PROTEIN: NEGATIVE
Specific Gravity, Urine: 1.007 (ref 1.001–1.035)

## 2015-10-10 ENCOUNTER — Ambulatory Visit (INDEPENDENT_AMBULATORY_CARE_PROVIDER_SITE_OTHER): Payer: Medicaid Other | Admitting: Family Medicine

## 2015-10-10 ENCOUNTER — Ambulatory Visit (INDEPENDENT_AMBULATORY_CARE_PROVIDER_SITE_OTHER): Payer: Medicaid Other | Admitting: Clinical

## 2015-10-10 VITALS — BP 128/79 | HR 89 | Wt 148.1 lb

## 2015-10-10 DIAGNOSIS — F4322 Adjustment disorder with anxiety: Secondary | ICD-10-CM | POA: Diagnosis not present

## 2015-10-10 DIAGNOSIS — O0933 Supervision of pregnancy with insufficient antenatal care, third trimester: Secondary | ICD-10-CM | POA: Diagnosis not present

## 2015-10-10 DIAGNOSIS — Z3483 Encounter for supervision of other normal pregnancy, third trimester: Secondary | ICD-10-CM

## 2015-10-10 LAB — POCT URINALYSIS DIP (DEVICE)
Bilirubin Urine: NEGATIVE
Glucose, UA: NEGATIVE mg/dL
HGB URINE DIPSTICK: NEGATIVE
Ketones, ur: NEGATIVE mg/dL
Nitrite: NEGATIVE
PROTEIN: NEGATIVE mg/dL
UROBILINOGEN UA: 0.2 mg/dL (ref 0.0–1.0)
pH: 6 (ref 5.0–8.0)

## 2015-10-10 NOTE — Progress Notes (Signed)
States had a lot of edema in right foot/ going up leg last week, better today. C/o occasional headaches.

## 2015-10-10 NOTE — Progress Notes (Signed)
Subjective:  Morgan Ramirez is a 25 y.o. G1P0 at 7954w6d being seen today for ongoing prenatal care.  She is currently monitored for the following issues for this low-risk pregnancy and has Supervision of normal pregnancy, antepartum; Late prenatal care affecting pregnancy, antepartum; Pregnancy with adoption planned, antepartum; Anemia of mother in pregnancy, antepartum; GBS (group B streptococcus) UTI complicating pregnancy; Threatened preterm labor; and Trichomonal vaginitis during pregnancy, antepartum on her problem list.  Patient reports no complaints.  Contractions: Irregular. Vag. Bleeding: None.  Movement: Present. Denies leaking of fluid.   The following portions of the patient's history were reviewed and updated as appropriate: allergies, current medications, past family history, past medical history, past social history, past surgical history and problem list. Problem list updated.  Objective:   Vitals:   10/10/15 1559  BP: 128/79  Pulse: 89  Weight: 148 lb 1.6 oz (67.2 kg)    Fetal Status: Fetal Heart Rate (bpm): 134   Movement: Present     General:  Alert, oriented and cooperative. Patient is in no acute distress.  Skin: Skin is warm and dry. No rash noted.   Cardiovascular: Normal heart rate noted  Respiratory: Normal respiratory effort, no problems with respiration noted  Abdomen: Soft, gravid, appropriate for gestational age. Pain/Pressure: Present     Pelvic:  Cervical exam deferred        Extremities: Normal range of motion.  Edema: Trace  Mental Status: Normal mood and affect. Normal behavior. Normal judgment and thought content.   Urinalysis: Urine Protein: Negative Urine Glucose: Negative  Assessment and Plan:  Pregnancy: G1P0 at 5054w6d  1. Supervision of normal pregnancy, antepartum, third trimester FHT and FH normal  Term labor symptoms and general obstetric precautions including but not limited to vaginal bleeding, contractions, leaking of fluid and fetal  movement were reviewed in detail with the patient. Please refer to After Visit Summary for other counseling recommendations.  Return in about 1 week (around 10/17/2015).   Levie HeritageJacob J Kayman Snuffer, DO

## 2015-10-10 NOTE — Progress Notes (Signed)
  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 Pt would benefit from psychoeducation and brief therapeutic intervention.  Stage of Change: determination  PLAN: 1. F/U with behavioral health clinician as needed 2. Psychiatric Medications: none 3. Behavioral recommendations:   -Continue advocating for self -Continue to write out emotions -Consider reading educational material regarding coping with stress and anxiety  SUBJECTIVE: Pt. referred by Candelaria CelesteJacob Stinson, DO,  for situational anxiety Pt. reports the following symptoms/concerns: Pt states that she is feeling stressed and anxious over upcoming birth and adoption; writing daily helps her cope Duration of problem: Less than one month Severity: mild   OBJECTIVE: Orientation & Cognition: Oriented x3. Thought processes normal and appropriate to situation. Mood: appropriate Affect: appropriate Appearance: appropriate Risk of harm to self or others: no risk of harm to self or others Substance use: no Assessments administered: PHQ9: 6/ GAD7: 7  Diagnosis: Adjustment reaction with anxious mood CPT Code: F43.22  -------------------------------------------- Other(s) present in the room: friend  Time spent with patient in exam room: 30 minutes, 4:30-5:00pm  Depression screen [facility] 10/10/2015  Decreased Interest 1  Down, Depressed, Hopeless 1  PHQ - 2 Score 2  Altered sleeping 2  Tired, decreased energy 1  Change in appetite 0  Feeling bad or failure about yourself  1  Trouble concentrating 0  Moving slowly or fidgety/restless 0  Suicidal thoughts 0  PHQ-9 Score 6   GAD 7 : Generalized Anxiety Score 10/10/2015  Nervous, Anxious, on Edge 1  Control/stop worrying 1  Worry too much - different things 2  Trouble relaxing 1  Restless 1  Easily annoyed or irritable 1  Afraid - awful might happen 0  Total GAD 7 Score 7

## 2015-10-14 ENCOUNTER — Inpatient Hospital Stay (HOSPITAL_COMMUNITY)
Admission: AD | Admit: 2015-10-14 | Discharge: 2015-10-17 | DRG: 775 | Disposition: A | Discharge status: Home / Self Care | Payer: Medicaid Other | Source: Ambulatory Visit | Attending: Obstetrics & Gynecology | Admitting: Obstetrics & Gynecology

## 2015-10-14 ENCOUNTER — Encounter (HOSPITAL_COMMUNITY): Payer: Self-pay | Admitting: *Deleted

## 2015-10-14 DIAGNOSIS — O134 Gestational [pregnancy-induced] hypertension without significant proteinuria, complicating childbirth: Secondary | ICD-10-CM | POA: Diagnosis not present

## 2015-10-14 DIAGNOSIS — IMO0001 Reserved for inherently not codable concepts without codable children: Secondary | ICD-10-CM

## 2015-10-14 DIAGNOSIS — O99824 Streptococcus B carrier state complicating childbirth: Secondary | ICD-10-CM | POA: Diagnosis present

## 2015-10-14 DIAGNOSIS — B951 Streptococcus, group B, as the cause of diseases classified elsewhere: Secondary | ICD-10-CM

## 2015-10-14 DIAGNOSIS — O0933 Supervision of pregnancy with insufficient antenatal care, third trimester: Secondary | ICD-10-CM

## 2015-10-14 DIAGNOSIS — O99013 Anemia complicating pregnancy, third trimester: Secondary | ICD-10-CM

## 2015-10-14 DIAGNOSIS — Z3A39 39 weeks gestation of pregnancy: Secondary | ICD-10-CM | POA: Diagnosis not present

## 2015-10-14 DIAGNOSIS — Z3483 Encounter for supervision of other normal pregnancy, third trimester: Secondary | ICD-10-CM

## 2015-10-14 DIAGNOSIS — Z3403 Encounter for supervision of normal first pregnancy, third trimester: Secondary | ICD-10-CM | POA: Diagnosis present

## 2015-10-14 DIAGNOSIS — O2343 Unspecified infection of urinary tract in pregnancy, third trimester: Secondary | ICD-10-CM

## 2015-10-14 DIAGNOSIS — Z3490 Encounter for supervision of normal pregnancy, unspecified, unspecified trimester: Secondary | ICD-10-CM

## 2015-10-14 LAB — CBC
HCT: 28.1 % — ABNORMAL LOW (ref 36.0–46.0)
HEMOGLOBIN: 9.3 g/dL — AB (ref 12.0–15.0)
MCH: 26.3 pg (ref 26.0–34.0)
MCHC: 33.1 g/dL (ref 30.0–36.0)
MCV: 79.4 fL (ref 78.0–100.0)
Platelets: 311 10*3/uL (ref 150–400)
RBC: 3.54 MIL/uL — AB (ref 3.87–5.11)
RDW: 14.7 % (ref 11.5–15.5)
WBC: 11.6 10*3/uL — ABNORMAL HIGH (ref 4.0–10.5)

## 2015-10-14 LAB — COMPREHENSIVE METABOLIC PANEL
ALBUMIN: 3.2 g/dL — AB (ref 3.5–5.0)
ALT: 19 U/L (ref 14–54)
AST: 26 U/L (ref 15–41)
Alkaline Phosphatase: 187 U/L — ABNORMAL HIGH (ref 38–126)
Anion gap: 9 (ref 5–15)
BUN: 7 mg/dL (ref 6–20)
CHLORIDE: 107 mmol/L (ref 101–111)
CO2: 21 mmol/L — ABNORMAL LOW (ref 22–32)
Calcium: 9.6 mg/dL (ref 8.9–10.3)
Creatinine, Ser: 0.89 mg/dL (ref 0.44–1.00)
GFR calc Af Amer: >60 mL/min (ref >60)
GFR calc non Af Amer: >60 mL/min (ref >60)
GLUCOSE: 90 mg/dL (ref 65–99)
POTASSIUM: 3.7 mmol/L (ref 3.5–5.1)
SODIUM: 137 mmol/L (ref 135–145)
Total Bilirubin: 0.2 mg/dL — ABNORMAL LOW (ref 0.3–1.2)
Total Protein: 6.7 g/dL (ref 6.5–8.1)

## 2015-10-14 LAB — TYPE AND SCREEN
ABO/RH(D): B POS
Antibody Screen: NEGATIVE

## 2015-10-14 MED ORDER — LIDOCAINE HCL (PF) 1 % IJ SOLN
INTRAMUSCULAR | Status: AC
  Filled 2015-10-14: qty 30

## 2015-10-14 MED ORDER — FLEET ENEMA 7-19 GM/118ML RE ENEM: 1.0000 | ENEMA | RECTAL | Status: DC | PRN

## 2015-10-14 MED ORDER — OXYTOCIN 40 UNITS IN LACTATED RINGERS INFUSION - SIMPLE MED: 2.5000 [IU]/h | INTRAVENOUS | Status: DC

## 2015-10-14 MED ORDER — OXYCODONE-ACETAMINOPHEN 5-325 MG PO TABS: 1.0000 | ORAL_TABLET | ORAL | Status: DC | PRN

## 2015-10-14 MED ORDER — ONDANSETRON HCL 4 MG/2ML IJ SOLN: 4.0000 mg | Freq: Four times a day (QID) | INTRAMUSCULAR | Status: DC | PRN

## 2015-10-14 MED ORDER — OXYTOCIN BOLUS FROM INFUSION
500.0000 mL | Freq: Once | INTRAVENOUS | Status: AC
  Administered 2015-10-15: 500 mL via INTRAVENOUS

## 2015-10-14 MED ORDER — OXYTOCIN 40 UNITS IN LACTATED RINGERS INFUSION - SIMPLE MED
INTRAVENOUS | Status: AC
  Administered 2015-10-15: 2 m[IU]/min via INTRAVENOUS
  Filled 2015-10-14: qty 1000

## 2015-10-14 MED ORDER — LIDOCAINE HCL (PF) 1 % IJ SOLN
30.0000 mL | INTRAMUSCULAR | Status: DC | PRN
  Filled 2015-10-14: qty 30

## 2015-10-14 MED ORDER — LACTATED RINGERS IV SOLN
INTRAVENOUS | Status: DC
  Administered 2015-10-15: 05:00:00 via INTRAVENOUS

## 2015-10-14 MED ORDER — ACETAMINOPHEN 325 MG PO TABS
650.0000 mg | ORAL_TABLET | ORAL | Status: DC | PRN
Start: 2015-10-14 — End: 2015-10-15

## 2015-10-14 MED ORDER — SODIUM CHLORIDE 0.9 % IV SOLN
2.0000 g | Freq: Once | INTRAVENOUS | Status: AC
  Administered 2015-10-14: 2 g via INTRAVENOUS
  Filled 2015-10-14: qty 2000

## 2015-10-14 MED ORDER — OXYCODONE-ACETAMINOPHEN 5-325 MG PO TABS: 2.0000 | ORAL_TABLET | ORAL | Status: DC | PRN

## 2015-10-14 MED ORDER — DEXTROSE 5 % IV SOLN
5.0000 10*6.[IU] | Freq: Once | INTRAVENOUS | Status: DC
  Filled 2015-10-14: qty 5

## 2015-10-14 MED ORDER — SOD CITRATE-CITRIC ACID 500-334 MG/5ML PO SOLN
30.0000 mL | ORAL | Status: DC | PRN
  Administered 2015-10-15: 30 mL via ORAL
  Filled 2015-10-14: qty 15

## 2015-10-14 MED ORDER — LACTATED RINGERS IV SOLN: 500.0000 mL | INTRAVENOUS | Status: DC | PRN

## 2015-10-14 MED ORDER — PENICILLIN G POTASSIUM 5000000 UNITS IJ SOLR
2.5000 10*6.[IU] | INTRAVENOUS | Status: DC
  Filled 2015-10-14: qty 2.5

## 2015-10-14 NOTE — H&P (Signed)
Morgan Ramirez is a 25 y.o. female presenting for Active labor, contractions started at 1800, denies LOF, small amt bloody show. Plans adoption, .  Plans to place child for adoption; (no skin to skin); attorney led adoption Clinic  [facility] Prenatal Labs  Dating  [facility] [place] 8/12 EDC; ultrasound on 7/12 32.0; confirm LMP with patient Blood type: --/--/B POS, B POS (07/23 1422)   Genetic Screen Late to Care Antibody:NEG (07/23 1422)  Anatomic [place]  nml, limited views Rubella: 1.35 (07/05 0904)  GTT Early:               Third trimester: 71 RPR: NON REAC (07/05 0904)   Flu vaccine  Did not obtain Fall 16 HBsAg: NEGATIVE (07/05 0904)   TDaP vaccine                                               Rhogam: NA HIV: NONREACTIVE (07/05 0904)   Baby Food   No; plans to place for adoption                                            GBS:  POS (urine)   Contraception  Pap:  Negative  Circumcision    Pediatrician  n/a   Support Person  Tammy (friend, "like a mom")     OB History    Gravida Para Term Preterm AB Living   1         0   SAB TAB Ectopic Multiple Live Births                 Past Medical History:  Diagnosis Date  . Chronic back pain   . Trichimoniasis    Past Surgical History:  Procedure Laterality Date  . NO PAST SURGERIES     Family History: She was adopted. Family history is unknown by patient. Social History:  reports that she has never smoked. She has never used smokeless tobacco. She reports that she does not drink alcohol or use drugs.     Maternal Diabetes: No Genetic Screening: Late PNC, not done Maternal Ultrasounds/Referrals: Normal Fetal Ultrasounds or other Referrals:  None Maternal Substance Abuse:  No Significant Maternal Medications:  None Significant Maternal Lab Results:  Lab values include: Group B Strep positive Other Comments:  Adoption  Review of Systems  Constitutional: Negative.   HENT: Negative.   Eyes: Negative.   Respiratory:  Negative.   Cardiovascular: Negative.   Gastrointestinal: Negative.   Genitourinary: Negative.   Musculoskeletal: Negative.   Skin: Negative.   Neurological: Negative.   Endo/Heme/Allergies: Negative.   Psychiatric/Behavioral: Negative.    Maternal Medical History:  Reason for admission: Contractions.   Contractions: Onset was 3-5 hours ago.   Frequency: regular.   Duration is approximately 60 seconds.   Perceived severity is moderate.    Fetal activity: Perceived fetal activity is normal.   Last perceived fetal movement was within the past hour.    Prenatal complications: no prenatal complications Prenatal Complications - Diabetes: none.    Dilation: 7 Effacement (%): 100 Station: 0 Exam by:: Jones, A RN Blood pressure 139/94, pulse 90, temperature 98.7 F (37.1 C), resp. rate 20, height 5\' 3"  (1.6 m), weight 67.1 kg (148  lb), last menstrual period 01/11/2015.   Maternal Exam:  Uterine Assessment: Contraction strength is moderate.  Contraction frequency is regular.   Abdomen: Patient reports no abdominal tenderness. Fetal presentation: vertex  Introitus: Normal vagina.  Pelvis: adequate for delivery.   Cervix: Cervix evaluated by digital exam.     Fetal Exam Fetal Monitor Review: Mode: ultrasound.   Variability: moderate (6-25 bpm).   Pattern: accelerations present.       Physical Exam  Constitutional: She is oriented to person, place, and time. She appears well-developed and well-nourished.  HENT:  Head: Normocephalic.  Eyes: Conjunctivae and EOM are normal. Pupils are equal, round, and reactive to light.  Neck: Normal range of motion.  Cardiovascular: Normal rate, regular rhythm and normal heart sounds.   Respiratory: Effort normal and breath sounds normal.  GI: Soft. Bowel sounds are normal.  Genitourinary: Vagina normal.  Musculoskeletal: Normal range of motion.  Neurological: She is alert and oriented to person, place, and time. She has normal  reflexes.  Skin: Skin is warm and dry.  Psychiatric: She has a normal mood and affect. Her behavior is normal. Judgment and thought content normal.    Prenatal labs: ABO, Rh: --/--/B POS, B POS (07/23 1422) Antibody: NEG (07/23 1422) Rubella: 1.35 (07/05 0904) RPR: NON REAC (07/05 0904)  HBsAg: NEGATIVE (07/05 0904)  HIV: NONREACTIVE (07/05 0904)  GBS: Positive (08/03 0000)   Assessment/Plan: Admit for labor GBS+, begin ABT NSVD   [name] 10/14/2015, 9:40 PM  CNM attestation:  I have seen and examined this patient; I agree with above documentation in the CNM student's note.   Launa FlightBrittany Ramirez is a 25 y.o. G1P0 reporting contractions +FM, denies LOF, HA, vision changes or epigastric pain. Scant bloody show.  PE: BP 139/94 (BP Location: Right Arm)   Pulse 90   Temp 98.7 F (37.1 C)   Resp 20   Ht 5\' 3"  (1.6 m)   Wt 148 lb (67.1 kg)   LMP 01/11/2015 (LMP Unknown)   BMI 26.22 kg/m  Gen: calm comfortable, NAD Resp: normal effort, no distress Abd: gravid  ROS, labs, PMH reviewed NST reactive   Assessment: 1. Labor: active 2. Fetal Wellbeing: Category I 3. Pain Control: Nitrous oxide 4. GBS: pos 5. 39.3 week IUP 6. HTN  Plan:  1. Admit to BS per consult with MD 2. Routine L&D orders 3. Analgesia/anesthesia PRN  4. Pre-E labs  [name], CNM 10:31 PM

## 2015-10-15 ENCOUNTER — Encounter (HOSPITAL_COMMUNITY): Payer: Self-pay

## 2015-10-15 DIAGNOSIS — O134 Gestational [pregnancy-induced] hypertension without significant proteinuria, complicating childbirth: Secondary | ICD-10-CM

## 2015-10-15 DIAGNOSIS — O99824 Streptococcus B carrier state complicating childbirth: Secondary | ICD-10-CM

## 2015-10-15 DIAGNOSIS — Z3A39 39 weeks gestation of pregnancy: Secondary | ICD-10-CM

## 2015-10-15 LAB — URINALYSIS, ROUTINE W REFLEX MICROSCOPIC
BILIRUBIN URINE: NEGATIVE
Glucose, UA: NEGATIVE mg/dL
Ketones, ur: 15 mg/dL — AB
Nitrite: NEGATIVE
PH: 6.5 (ref 5.0–8.0)
Protein, ur: NEGATIVE mg/dL

## 2015-10-15 LAB — PROTEIN / CREATININE RATIO, URINE
CREATININE, URINE: 42 mg/dL
Protein Creatinine Ratio: 0.43 mg/mg{Cre} — ABNORMAL HIGH (ref 0.00–0.15)
Total Protein, Urine: 18 mg/dL

## 2015-10-15 LAB — URINE MICROSCOPIC-ADD ON

## 2015-10-15 LAB — RPR: RPR: NONREACTIVE

## 2015-10-15 MED ORDER — TERBUTALINE SULFATE 1 MG/ML IJ SOLN
INTRAMUSCULAR | Status: AC
  Filled 2015-10-15: qty 1

## 2015-10-15 MED ORDER — IBUPROFEN 600 MG PO TABS
600.0000 mg | ORAL_TABLET | Freq: Four times a day (QID) | ORAL | Status: DC
  Administered 2015-10-15 – 2015-10-17 (×9): 600 mg via ORAL
  Filled 2015-10-15 (×9): qty 1

## 2015-10-15 MED ORDER — FENTANYL CITRATE (PF) 100 MCG/2ML IJ SOLN
INTRAMUSCULAR | Status: AC
  Administered 2015-10-15: 100 ug via INTRAVENOUS
  Filled 2015-10-15: qty 2

## 2015-10-15 MED ORDER — FENTANYL CITRATE (PF) 100 MCG/2ML IJ SOLN
100.0000 ug | INTRAMUSCULAR | Status: DC | PRN
  Administered 2015-10-15 (×2): 100 ug via INTRAVENOUS
  Filled 2015-10-15: qty 2

## 2015-10-15 MED ORDER — EPHEDRINE 5 MG/ML INJ
10.0000 mg | INTRAVENOUS | Status: DC | PRN
  Filled 2015-10-15: qty 4

## 2015-10-15 MED ORDER — LACTATED RINGERS IV SOLN: 500.0000 mL | Freq: Once | INTRAVENOUS | Status: DC

## 2015-10-15 MED ORDER — FENTANYL 2.5 MCG/ML BUPIVACAINE 1/10 % EPIDURAL INFUSION (WH - ANES): 14.0000 mL/h | INTRAMUSCULAR | Status: DC | PRN

## 2015-10-15 MED ORDER — COCONUT OIL OIL: 1.0000 "application " | TOPICAL_OIL | Status: DC | PRN

## 2015-10-15 MED ORDER — SIMETHICONE 80 MG PO CHEW
80.0000 mg | CHEWABLE_TABLET | ORAL | Status: DC | PRN
Start: 2015-10-15 — End: 2015-10-17

## 2015-10-15 MED ORDER — SENNOSIDES-DOCUSATE SODIUM 8.6-50 MG PO TABS
2.0000 | ORAL_TABLET | ORAL | Status: DC
  Administered 2015-10-15 – 2015-10-16 (×2): 2 via ORAL
  Filled 2015-10-15 (×2): qty 2

## 2015-10-15 MED ORDER — DIPHENHYDRAMINE HCL 50 MG/ML IJ SOLN: 12.5000 mg | INTRAMUSCULAR | Status: DC | PRN

## 2015-10-15 MED ORDER — PHENYLEPHRINE 40 MCG/ML (10ML) SYRINGE FOR IV PUSH (FOR BLOOD PRESSURE SUPPORT)
80.0000 ug | PREFILLED_SYRINGE | INTRAVENOUS | Status: DC | PRN
Start: 2015-10-15 — End: 2015-10-15
  Filled 2015-10-15: qty 5

## 2015-10-15 MED ORDER — FENTANYL CITRATE (PF) 100 MCG/2ML IJ SOLN
100.0000 ug | Freq: Once | INTRAMUSCULAR | Status: AC
  Administered 2015-10-15: 100 ug via INTRAVENOUS

## 2015-10-15 MED ORDER — ZOLPIDEM TARTRATE 5 MG PO TABS: 5.0000 mg | ORAL_TABLET | Freq: Every evening | ORAL | Status: DC | PRN

## 2015-10-15 MED ORDER — ACETAMINOPHEN 325 MG PO TABS: 650.0000 mg | ORAL_TABLET | ORAL | Status: DC | PRN

## 2015-10-15 MED ORDER — BENZOCAINE-MENTHOL 20-0.5 % EX AERO
1.0000 "application " | INHALATION_SPRAY | CUTANEOUS | Status: DC | PRN
  Filled 2015-10-15 (×2): qty 56

## 2015-10-15 MED ORDER — WITCH HAZEL-GLYCERIN EX PADS: 1.0000 "application " | MEDICATED_PAD | CUTANEOUS | Status: DC | PRN

## 2015-10-15 MED ORDER — EPHEDRINE 5 MG/ML INJ
10.0000 mg | INTRAVENOUS | Status: DC | PRN
Start: 2015-10-15 — End: 2015-10-15
  Filled 2015-10-15: qty 4

## 2015-10-15 MED ORDER — OXYTOCIN 40 UNITS IN LACTATED RINGERS INFUSION - SIMPLE MED
1.0000 m[IU]/min | INTRAVENOUS | Status: DC
  Administered 2015-10-15: 2 m[IU]/min via INTRAVENOUS

## 2015-10-15 MED ORDER — TETANUS-DIPHTH-ACELL PERTUSSIS 5-2.5-18.5 LF-MCG/0.5 IM SUSP: 0.5000 mL | Freq: Once | INTRAMUSCULAR | Status: DC

## 2015-10-15 MED ORDER — TERBUTALINE SULFATE 1 MG/ML IJ SOLN
0.2500 mg | Freq: Once | INTRAMUSCULAR | Status: AC
  Administered 2015-10-15: 0.25 mg via SUBCUTANEOUS

## 2015-10-15 MED ORDER — ONDANSETRON HCL 4 MG PO TABS: 4.0000 mg | ORAL_TABLET | ORAL | Status: DC | PRN

## 2015-10-15 MED ORDER — AMPICILLIN SODIUM 2 G IJ SOLR
2.0000 g | Freq: Four times a day (QID) | INTRAMUSCULAR | Status: DC
  Administered 2015-10-15: 2 g via INTRAVENOUS
  Filled 2015-10-15 (×2): qty 2000

## 2015-10-15 MED ORDER — PRENATAL MULTIVITAMIN CH
1.0000 | ORAL_TABLET | Freq: Every day | ORAL | Status: DC
  Administered 2015-10-16: 1 via ORAL
  Filled 2015-10-15: qty 1

## 2015-10-15 MED ORDER — DIBUCAINE 1 % RE OINT: 1.0000 "application " | TOPICAL_OINTMENT | RECTAL | Status: DC | PRN

## 2015-10-15 MED ORDER — DIPHENHYDRAMINE HCL 25 MG PO CAPS: 25.0000 mg | ORAL_CAPSULE | Freq: Four times a day (QID) | ORAL | Status: DC | PRN

## 2015-10-15 MED ORDER — ONDANSETRON HCL 4 MG/2ML IJ SOLN: 4.0000 mg | INTRAMUSCULAR | Status: DC | PRN

## 2015-10-15 MED ORDER — PHENYLEPHRINE 40 MCG/ML (10ML) SYRINGE FOR IV PUSH (FOR BLOOD PRESSURE SUPPORT)
80.0000 ug | PREFILLED_SYRINGE | INTRAVENOUS | Status: DC | PRN
  Filled 2015-10-15: qty 5

## 2015-10-15 MED ORDER — TERBUTALINE SULFATE 1 MG/ML IJ SOLN: 0.2500 mg | Freq: Once | INTRAMUSCULAR | Status: DC | PRN

## 2015-10-15 NOTE — Progress Notes (Signed)
Patient ID: Morgan Ramirez, female   DOB: 06/13/1990, 25 y.o.   MRN: [id number] Morgan Ramirez is a 25 y.o. G1P0 at 8283w4d.  Subjective: Urge to push  Objective: BP (!) 149/78   Pulse 93   Temp 98.5 F (36.9 C) (Oral)   Resp 18   Ht 5\' 3"  (1.6 m)   Wt 148 lb (67.1 kg)   LMP 01/11/2015 (LMP Unknown)   SpO2 98%   BMI 26.22 kg/m    FHT:  FHR: 130 bpm, variability: mod,  accelerations:  15x15,  decelerations:  Prolonged decel x 12 minutes w/ slow return to baseline after multiple position changes, fluid bolus O2, terb.  UC:   Q 2 minutes, strong Dilation: 8.5 Effacement (%): 100 Cervical Position: Mid-position Station: +1 Presentation: Vertex Exam by:: Ivonne AndrewV. Tyquasia Pant, CNM   Labs: Results for orders placed or performed during the hospital encounter of 10/14/15 (from the past 24 hour(s))  Comprehensive metabolic panel     Status: Abnormal   Collection Time: 10/14/15  9:10 PM  Result Value Ref Range   Sodium 137 135 - 145 mmol/L   Potassium 3.7 3.5 - 5.1 mmol/L   Chloride 107 101 - 111 mmol/L   CO2 21 (L) 22 - 32 mmol/L   Glucose, Bld 90 65 - 99 mg/dL   BUN 7 6 - 20 mg/dL   Creatinine, Ser 4.090.89 0.44 - 1.00 mg/dL   Calcium 9.6 8.9 - 81.110.3 mg/dL   Total Protein 6.7 6.5 - 8.1 g/dL   Albumin 3.2 (L) 3.5 - 5.0 g/dL   AST 26 15 - 41 U/L   ALT 19 14 - 54 U/L   Alkaline Phosphatase 187 (H) 38 - 126 U/L   Total Bilirubin 0.2 (L) 0.3 - 1.2 mg/dL   GFR calc non Af Amer >60 >60 mL/min   GFR calc Af Amer >60 >60 mL/min   Anion gap 9 5 - 15  CBC     Status: Abnormal   Collection Time: 10/14/15  9:10 PM  Result Value Ref Range   WBC 11.6 (H) 4.0 - 10.5 K/uL   RBC 3.54 (L) 3.87 - 5.11 MIL/uL   Hemoglobin 9.3 (L) 12.0 - 15.0 g/dL   HCT 91.428.1 (L) 78.236.0 - 95.646.0 %   MCV 79.4 78.0 - 100.0 fL   MCH 26.3 26.0 - 34.0 pg   MCHC 33.1 30.0 - 36.0 g/dL   RDW 21.314.7 08.611.5 - 57.815.5 %   Platelets 311 150 - 400 K/uL  [facility] screen [facility]     Status: None   Collection Time: 10/14/15   9:10 PM  Result Value Ref Range   ABO/RH(D) B POS    Antibody Screen NEG    Sample Expiration 10/17/2015   Protein / creatinine ratio, urine     Status: Abnormal   Collection Time: 10/15/15 12:48 AM  Result Value Ref Range   Creatinine, Urine 42.00 mg/dL   Total Protein, Urine 18 mg/dL   Protein Creatinine Ratio 0.43 (H) 0.00 - 0.15 mg/mg[Cre]  Urinalysis, Routine w reflex microscopic (not [facility])     Status: Abnormal   Collection Time: 10/15/15 12:48 AM  Result Value Ref Range   Color, Urine YELLOW YELLOW   APPearance CLEAR CLEAR   Specific Gravity, Urine <1.005 (L) 1.005 - 1.030   pH 6.5 5.0 - 8.0   Glucose, UA NEGATIVE NEGATIVE mg/dL   Hgb urine dipstick LARGE (A) NEGATIVE   Bilirubin Urine NEGATIVE NEGATIVE   Ketones,  ur 15 (A) NEGATIVE mg/dL   Protein, ur NEGATIVE NEGATIVE mg/dL   Nitrite NEGATIVE NEGATIVE   Leukocytes, UA MODERATE (A) NEGATIVE  Urine microscopic-add on     Status: Abnormal   Collection Time: 10/15/15 12:48 AM  Result Value Ref Range   Squamous Epithelial / LPF 0-5 (A) NONE SEEN   WBC, UA 6-30 0 - 5 WBC/hpf   RBC / HPF 0-5 0 - 5 RBC/hpf   Bacteria, UA RARE (A) NONE SEEN    Assessment / Plan: [redacted]w[redacted]d week IUP Labor: transition Fetal Wellbeing:  Category I-II (now category I) Pain Control:  Nitrous oxide Anticipated MOD:  SVD GHTN vs Pre-E w/out severe features (P:C elevated, but urine had large Hgb)  Dorathy Kinsman, CNM 10/15/2015 2:25 AM

## 2015-10-15 NOTE — Plan of Care (Signed)
Problem: Activity: Goal: Will verbalize the importance of balancing activity with adequate rest periods Outcome: Completed/Met Date Met: 10/15/15 Patient up ad lib for ADL's.  Encouraged ambulation in halls. Goal: Ability to tolerate increased activity will improve Up ad lib without assistance.   Encouraged to call for assist if needed.  Problem: Coping: Goal: Ability to cope will improve Outcome: Completed/Met Date Met: 10/15/15 Patient with appropriate behavior for situation.  Calm and cooperative.  Facilitated verbalization of feeling about BUFA, patient claims, "everything was well thought off and planned accordingly."  Problem: Nutritional: Goal: Dietary intake will improve Outcome: Completed/Met Date Met: 10/15/15 Dinner collected from room, all serving of food consumed.  Patient claims no problem with food intake.  Goal: Mother's verbalization of comfort with breastfeeding process will improve Outcome: Not Applicable Date Met: 10/30/66 BUFA patient   Problem: Pain Management: Goal: General experience of comfort will improve and pain level will decrease Outcome: Completed/Met Date Met: 10/15/15 Good pain control with scheduled Ibuprofen.  Informed patient to call for pain medication if needed.  Problem: Respiratory: Goal: Ability to maintain adequate ventilation will improve Outcome: Completed/Met Date Met: 10/15/15 Lung sounds clear on all lobes.  Respiration unlabored.  Problem: Urinary Elimination: Goal: Ability to reestablish a normal urinary elimination pattern will improve Outcome: Completed/Met Date Met: 10/15/15 Voiding without difficulty.

## 2015-10-15 NOTE — Progress Notes (Signed)
Patient arrived in good spirits, IV saline locked. Scant bleeding.

## 2015-10-15 NOTE — Progress Notes (Signed)
Patient ID: Morgan Ramirez, female   DOB: 05/25/1990, 25 y.o.   MRN: [id number] Morgan Ramirez is a 25 y.o. G1P0 at 4056w4d.  Subjective: Had been sleeping through contractions after Fentanyl.   Objective: BP (!) 149/78   Pulse 93   Temp 98.3 F (36.8 C) (Oral)   Resp 18   Ht 5\' 3"  (1.6 m)   Wt 148 lb (67.1 kg)   LMP 01/11/2015 (LMP Unknown)   SpO2 100%   BMI 26.22 kg/m    FHT:  FHR: 135 bpm, variability: mod,  accelerations:  15x15,  decelerations:  none UC:   Q 2-4 minutes, mod by palpation Dilation: 8 Effacement (%): 90 Cervical Position: Middle Station: 0 Presentation: Vertex Exam by:: Ivonne AndrewV. Kellyn Mansfield CNM   IUPC placed. Unsure if placed in proper position so IUPC replaced. Flash-back seen. UC's palpating mild-mod. Pt not very uncomfortable-appearing. Likely in proper proper position,just having mild-mod contractions.   Labs: NA  Assessment / Plan: 956w4d week IUP Labor: Protracted active phase. Contractions not palpating strong since Terb. Fetal Wellbeing:  Category I Pain Control:  Fentanyl Anticipated MOD:  SVD Start pitocin if contractions inadequate per IUPC.   [place]ia Trygve Thal, [place] 10/15/2015 5:32 AM

## 2015-10-15 NOTE — Progress Notes (Signed)
CSW met with [name] at bedside. She was accompanied by a friend/visitor Tammy. This Probation officer explained her role and reasoning for visit.   [name] reported to this Probation officer that she is doing well post-L&D and confirms her previous instruction of not wanting  to see the baby, do skin to skin, and wants adoptive parents to know the baby and baby to know them with smell. She again processed  how she does not want to have an emotional attachment with regards to seeing the baby.    At this time, adoptive parents are in the hospital waiting room. [name] has requested that this writer meet and speak with adoptive parents to re-assure them that baby is born and in nursery.   This Probation officer met with adoptive parents Baldo Ash and Charna Archer who inquired when they will be able to go back and see the baby. This Probation officer noted to them she was unsure of when; however, will communicate to nursing staff caring for [name] and baby that they are desiring to visit with baby. Baldo Ash and [place] further noted to this Probation officer that they have an attorney which whom they are working with to finalize adoption; however, because it is the weekend, nothing is able to occur today due to the court house being closed. This Probation officer made [place] aware that she will relay all information to clinical team and will provide an update to them following if  appropriate.   This Probation officer made RN aware. RN verbalized she would speak with [name] further to try and determine a plan for [place] to see child while also maintaining her wants/needs as stated above.   At this time, no other needs addressed or requested. CSW will continue to follow should any further needs arise.   Cortny Bambach, MSW, LCSW-A Clinical Social Worker  [facility]  Office: [phone]

## 2015-10-16 LAB — HIV ANTIBODY (ROUTINE TESTING W REFLEX): HIV Screen 4th Generation wRfx: NONREACTIVE

## 2015-10-16 NOTE — Progress Notes (Signed)
CSW met with [name] to attempt to retrieve relinquishment documents for infant.  [name] informed CSW that [name] did not have a copy and CSW will need to contact potential adopting mom to retrieve those documents.  CSW communicated that CSW left a voicemail message for potential adopting mom and attorney, and no one has returned CSW call. CSW reviewed dc options for [name].  [name] was informed that if [name] was to leave the hospital without providing any documentation for CSW, CSW will call CPS.  [name] communicated that CPS involvement was not an option and [name] will contact potential adopting parents to attempt to retrieve requested forms.    [name] as able to contact adopting parents via telephone and inform them of the need for CSW to obtain signed documents of [name] relinquishing [name]'s rights.  CSW requested to speak with potential adopting and [name] passed CSW the phone.   With adopting parent's permission, CSW placed the call on speaker phoneand shared with adopting parent's the importance of CSW obtaining requested documents prior to [name]'s dc in effort to avoid having CPS involved.  Adopting parents communicated that at this time, they were not available to bring documents and will provide the documents on Monday (8/28) morning.  CSW informed adopting parents that bring the papers in the morning would prolong [name]'s dc.  CSW informed adopting parents that [name] was cleared for dc today and was expecting to go home.  Adopting parents requested [name] to stay overnight to avoid CPS involvement and [name] agreed.   CSW inquired about post adoption follow counseling and [name] communicated that Roselyn Reef, with [facility] hospital clinic will be scheduling [name] an appointment.    CSW offered to take phots for [name] for keepsake, and [name] gave CSW permission.  [name] expressed to CSW that [name] would contact [name] in the near future to have CSW to send [name] the pictures.   CSW all had [name] to sign Patient Request for Access forms for [name] and infant.   Laurey Arrow, MSW, LCSW Clinical Social Work [phone]

## 2015-10-16 NOTE — Discharge Instructions (Signed)
Postpartum Care After Vaginal Delivery After you deliver your newborn (postpartum period), the usual stay in the hospital is 24-72 hours. If there were problems with your labor or delivery, or if you have other medical problems, you might be in the hospital longer.  While you are in the hospital, you will receive help and instructions on how to care for yourself and your newborn during the postpartum period.  While you are in the hospital:  Be sure to tell your nurses if you have pain or discomfort, as well as where you feel the pain and what makes the pain worse.  If you had an incision made near your vagina (episiotomy) or if you had some tearing during delivery, the nurses may put ice packs on your episiotomy or tear. The ice packs may help to reduce the pain and swelling.  If you are breastfeeding, you may feel uncomfortable contractions of your uterus for a couple of weeks. This is normal. The contractions help your uterus get back to normal size.  It is normal to have some bleeding after delivery.  For the first 1-3 days after delivery, the flow is red and the amount may be similar to a period.  It is common for the flow to start and stop.  In the first few days, you may pass some small clots. Let your nurses know if you begin to pass large clots or your flow increases.  Do not  flush blood clots down the toilet before having the nurse look at them.  During the next 3-10 days after delivery, your flow should become more watery and pink or brown-tinged in color.  Ten to fourteen days after delivery, your flow should be a small amount of yellowish-white discharge.  The amount of your flow will decrease over the first few weeks after delivery. Your flow may stop in 6-8 weeks. Most women have had their flow stop by 12 weeks after delivery.  You should change your sanitary pads frequently.  Wash your hands thoroughly with soap and water for at least 20 seconds after changing pads, using  the toilet, or before holding or feeding your newborn.  You should feel like you need to empty your bladder within the first 6-8 hours after delivery.  In case you become weak, lightheaded, or faint, call your nurse before you get out of bed for the first time and before you take a shower for the first time.  Within the first few days after delivery, your breasts may begin to feel tender and full. This is called engorgement. Breast tenderness usually goes away within 48-72 hours after engorgement occurs. You may also notice milk leaking from your breasts. If you are not breastfeeding, do not stimulate your breasts. Breast stimulation can make your breasts produce more milk.    Your hormones change after delivery. Sometimes the hormone changes can temporarily cause you to feel sad or tearful. These feelings should not last more than a few days. If these feelings last longer than that, you should talk to your caregiver.  If desired, talk to your caregiver about methods of family planning or contraception.  Talk to your caregiver about immunizations. Your caregiver may want you to have the following immunizations before leaving the hospital:  Tetanus, diphtheria, and pertussis (Tdap) or tetanus and diphtheria (Td) immunization. It is very important that you and your family (including grandparents) or others caring for your newborn are up-to-date with the Tdap or Td immunizations. The Tdap or Td immunization  can help protect your newborn from getting ill.  Rubella immunization.  Varicella (chickenpox) immunization.  Influenza immunization. You should receive this annual immunization if you did not receive the immunization during your pregnancy.   This information is not intended to replace advice given to you by your health care provider. Make sure you discuss any questions you have with your health care provider.   Document Released: 12/03/2006 Document Revised: 10/31/2011 Document Reviewed:  10/03/2011 Elsevier Interactive Patient Education [employer]2016 Elsevier Inc.

## 2015-10-16 NOTE — Progress Notes (Signed)
Chaplain met with Morgan Ramirez at her bedside. In the room was a friend, Lynelle Smoke, who identified herself as a [employer] officer. I asked if it was alright to openly discuss any matter in front of Tammy, and Morgan Pala gave permission for Tammy to sit in on our conversations. I explained HIPPA guidelines that state that at any time Morgan Staheli changed her mind and did not wish to discuss any matter in the presence of Tammy we would stop.  Morgan Hlavac is firm in her decision to place her son up for adoption, and appears to have little doubt that she is making the correct decision for her son and herself. The unnamed birth father has no desire to be a part of his son's future and readily signed over is parental rights. Morgan Gaulin indicated he was "relieved" with the decision of adoption.  Although adoption is a done deal, and Morgan Hauge has no doubts about her decision not to see the baby, do a skin to skin bonding, and "move on" there is indications in our conversation that there is some emotional and spiritual pain present. Our conversation dealt with seeking counsel both professional and informal in the near term to deal with any grief issues. Grief defined as the human emotions that occur with any life change. Morgan Sloss believes that suppression of these emotions makes her stronger. Our conversation debunked this and gave her the freedom/permission to deal with grief as it occurs. Morgan Banner was encouraged to give a name to her son, so that in the future she will not think of him as "the child" but rather as Will (the name she gave him during her pregnancy). Our conversation allowed her to acknowledge that the natural bonding a mother has with a child while she carries the child is valid even in an adoptive situation. It was at the point of her naming her son for her personal references that the break though occurred. She allowed herself to grieve.  Morgan Oesterling is not a practitioner of any faith group and does not have the support  of any community of faith. Other than an informal group of supportive co-workers, she is not a member of any formal social group which may provide support. Morgan Youtz plans to enter college and major in [employer]. This likely will detach her over time from the informal support group of co-workers. She was encouraged to seek out help at her university's counseling center as needed.  Morgan Dehaan expresses deep appreciation for the support of the nurses and social workers at [facility]. She is pleased that the adoptive parents have been actively engaged in bonding with her child and looks forward to the university experience.  Page chaplain should Morgan Harral need or request further spiritual or emotional care.  Sallee Lange. Mattew Chriswell, DMin, MDiv Chaplain

## 2015-10-16 NOTE — Progress Notes (Signed)
CSW left a voicemail message for potential adopting mom, Morgan Ramirez, at 8:46am to request signed relinquishment documents.   CSW left attorney, Kevin Brackett, a voicemail message requesting a call back to in effort for CSW to receive a copy of the signed relinquishment documents.   Morgan Ramirez, MSW, LCSW Clinical Social Work [phone] 

## 2015-10-16 NOTE — Discharge Summary (Signed)
OB Discharge Summary     Patient Name: Morgan Ramirez DOB: 07/16/1990 MRN: [id number]  Date of admission: 10/14/2015 Delivering MD: Morgan Ramirez   Date of discharge: 10/16/2015  Admitting diagnosis: 39w ctx Intrauterine pregnancy: 1238w4d     Secondary diagnosis:  Active Problems:   Active labor at term  Additional problems: Baby was for adoption     Discharge diagnosis: Term Pregnancy Delivered                                                                                                Post partum procedures:no  Augmentation: Pitocin  Complications: None  Hospital course:  Onset of Labor With Vaginal Delivery     25 y.o. yo G1P1001 at 2438w4d was admitted in Active Labor on 10/14/2015. Patient had an uncomplicated labor course as follows:  Membrane Rupture Time/Date: 12:53 AM ,10/15/2015   Intrapartum Procedures: Episiotomy: None [1]                                         Lacerations:  Vaginal [6]  Patient had a delivery of a Viable infant. 10/15/2015  Information for the patient's newborn:  Morgan Ramirez [[id number]]  Delivery Method: Vaginal, Spontaneous Delivery (Filed from Delivery Summary)   Delivery Note At 9:38 AM a viable female was delivered via  (direct OP  ).  APGAR: 9 ,9 ; weight  pending.   Placenta status: complete, intact , . 3V Cord:   Anesthesia:  None Episiotomy:  None Lacerations:  1st degree, good hemostasis. No repair Est. Blood Loss (mL):  200  [name].  Baby to Couplet care / Skin to Skin.  Morgan Ramirez 10/15/2015, 10:01 AM  OB FELLOW DELIVERY ATTESTATION  I was gloved and present for the delivery in its entirety, and I agree with the above resident's note.    Morgan PennaNicholas Schenk, MD 11:47 AM Pateint had an uncomplicated postpartum course.  [name], tolerating a regular diet, passing flatus, and urinating well. Patient is discharged home in stable condition on 10/16/15.    Physical exam Vitals:   10/15/15  1300 10/15/15 1600 10/15/15 2125 10/16/15 0559  BP: 125/72 (!) 104/42 (!) 110/59 (!) 112/48  Pulse: 68 80 87 83  Resp: 18 16 18 18   Temp: 97.7 F (36.5 C) 97.5 F (36.4 C) 98.1 F (36.7 C) 98.6 F (37 C)  TempSrc: Oral Oral Oral Oral  SpO2: 100%  100% 99%  Weight:      Height:       General: alert, cooperative and no distress Lochia: appropriate Uterine Fundus: firm Incision: N/A DVT Evaluation: No evidence of DVT seen on physical exam. Labs: Lab Results  Component Value Date   WBC 11.6 (H) 10/14/2015   HGB 9.3 (L) 10/14/2015   HCT 28.1 (L) 10/14/2015   MCV 79.4 10/14/2015   PLT 311 10/14/2015   CMP Latest Ref Rng & Units 10/14/2015  Glucose 65 - 99 mg/dL 90  BUN 6 - 20 mg/dL  7  Creatinine 0.44 - 1.00 mg/dL 1.61  Sodium 096 - 045 mmol/L 137  Potassium 3.5 - 5.1 mmol/L 3.7  Chloride 101 - 111 mmol/L 107  CO2 22 - 32 mmol/L 21(L)  Calcium 8.9 - 10.3 mg/dL 9.6  Total Protein 6.5 - 8.1 g/dL 6.7  Total Bilirubin 0.3 - 1.2 mg/dL 4.0(J)  Alkaline Phos 38 - 126 U/L 187(H)  AST 15 - 41 U/L 26  ALT 14 - 54 U/L 19    Discharge instruction: per After Visit Summary     Diet: routine diet  Activity: Advance as tolerated. Pelvic rest for 6 weeks.   Outpatient follow up:4 weeks Follow up Appt:Future Appointments Date Time Provider Department Center  10/19/2015 3:40 PM Willodean Rosenthal, MD [facility]   Follow up Visit:No Follow-up on file.  Postpartum contraception: Undecided  Newborn Data: Live born female  Birth Weight: 7 lb 0.2 oz (3180 g) APGAR: 9, 9  Baby Feeding: baby was adopted Disposition:in nursery   10/16/2015 Morgan Darter, MD

## 2015-10-17 NOTE — Discharge Summary (Signed)
Patient Name: Morgan Ramirez DOB: 12/03/1990 MRN: [id number]  Date of admission: 10/14/2015 Delivering MD: Morgan Ramirez   Date of discharge: 10/16/2015  Admitting diagnosis: 39w ctx Intrauterine pregnancy: 8322w4d     Secondary diagnosis:  Active Problems:   Active labor at term  Additional problems: Baby was for adoption                                                                    Discharge diagnosis: Term Pregnancy Delivered                                                                                                Post partum procedures:no  Augmentation: Pitocin  Complications: None  Hospital course:  Onset of Labor With Vaginal Delivery     25 y.o. yo G1P1001 at 222w4d was admitted in Active Labor on 10/14/2015. Patient had an uncomplicated labor course as follows:  Membrane Rupture Time/Date: 12:53 AM ,10/15/2015   Intrapartum Procedures: Episiotomy: None [1]                                         Lacerations:  Vaginal [6]  Patient had a delivery of a Viable infant. 10/15/2015  Information for the patient's newborn:  Morgan Ramirez, Boy Morgan Ramirez [[id number]][[id number]]  Delivery Method: Vaginal, Spontaneous Delivery (Filed from Delivery Summary)   Delivery Note At 9:38 AM a viablemale was delivered via (direct OP). APGAR: 9,9; weight pending.  Placenta status: complete, intact, . 3VCord:   Anesthesia: None Episiotomy: None Lacerations: 1st degree, good hemostasis. No repair Est. Blood Loss (mL): 200  [name]. Baby to Couplet care / Skin to Skin.  Morgan Ramirez 10/15/2015, 10:01 AM  OB FELLOW DELIVERY ATTESTATION  I was gloved and present for the delivery in its entirety, and I agree with the above resident's note.   Morgan PennaNicholas Schenk, MD 11:47 AM Pateint had an uncomplicated postpartum course.  [name], tolerating a regular diet, passing flatus, and urinating well. Patient is  discharged home in stable condition on 10/16/15.          Physical exam Vitals:   10/15/15 1300 10/15/15 1600 10/15/15 2125 10/16/15 0559  BP: 125/72 (!) 104/42 (!) 110/59 (!) 112/48  Pulse: 68 80 87 83  Resp: 18 16 18 18   Temp: 97.7 F (36.5 C) 97.5 F (36.4 C) 98.1 F (36.7 C) 98.6 F (37 C)  TempSrc: Oral Oral Oral Oral  SpO2: 100%  100% 99%  Weight:  Height:       General: alert, cooperative and no distress Lochia: appropriate Uterine Fundus: firm Incision: N/A DVT Evaluation: No evidence of DVT seen on physical exam. Labs: Recent Labs       Lab Results  Component Value Date   WBC 11.6 (H) 10/14/2015   HGB 9.3 (L) 10/14/2015   HCT 28.1 (L) 10/14/2015   MCV 79.4 10/14/2015   PLT 311 10/14/2015     CMP Latest Ref Rng & Units 10/14/2015  Glucose 65 - 99 mg/dL 90  BUN 6 - 20 mg/dL 7  Creatinine 1.61 - 0.96 mg/dL 0.45  Sodium 409 - 811 mmol/L 137  Potassium 3.5 - 5.1 mmol/L 3.7  Chloride 101 - 111 mmol/L 107  CO2 22 - 32 mmol/L 21(L)  Calcium 8.9 - 10.3 mg/dL 9.6  Total Protein 6.5 - 8.1 g/dL 6.7  Total Bilirubin 0.3 - 1.2 mg/dL 9.1(Y)  Alkaline Phos 38 - 126 U/L 187(H)  AST 15 - 41 U/L 26  ALT 14 - 54 U/L 19    Discharge instruction: per After Visit Summary     Diet: routine diet  Activity: Advance as tolerated. Pelvic rest for 6 weeks.   Outpatient follow up:4 weeks Follow up Appt:Future Appointments Date Time Provider Department Center  10/19/2015 3:40 PM Morgan Rosenthal, MD [facility]   Follow up Visit:No Follow-up on file.  Postpartum contraception: Undecided  Newborn Data: Live born female  Birth Weight: 7 lb 0.2 oz (3180 g) APGAR: 9, 9  Baby Feeding: baby was adopted Disposition:in nursery

## 2015-10-17 NOTE — Consult Note (Signed)
Brief consult with this mom who is giving her baby up for adoption. We reviewed what to do if her milk comes in, to stay comfortable and decrease her supply. Mom advised to wear a comfortable bra, use ice/cold cabbage leaves to decrease swelling and milk production, to hand express some milk for comfort, if needed, and I also gave mom a recipe for sage tea, which can decrease/suppress milk supply. Mom denied any questions, but knows she can call lactation as needed.

## 2015-10-17 NOTE — Progress Notes (Signed)
CSW called attorney, Kevin Brackett, to follow-up with CSW request for necessary documents. Attorney communicated to CSW that he was no longer going to be assisting the adopting family with completing requested documents.  Attorney informed CSW that the family will have another attorney to contact CSW on this date.  CSW thanked the attorney for the information.  Karolyne Timmons Boyd-Gilyard, MSW, LCSW Clinical Social Work [phone] 

## 2015-10-17 NOTE — Progress Notes (Signed)
Patient remains at bedside patiently waiting for documents. Adoptive family at bedside. Case management/social services have been at bedside.

## 2015-10-17 NOTE — Progress Notes (Signed)
Contacted Social Services at [phone] regarding outstanding adoption paperwork.  Representative will be up to see patient shortly.

## 2015-10-17 NOTE — Progress Notes (Signed)
Adopting family at bedside, patient eating breakfast.

## 2015-10-17 NOTE — Progress Notes (Signed)
Documentation has been executed to release infant to adoptive parents.  Patient, friend and adoptive parents have left the unit.  Original copy with two photos of document have been placed under social services door and a copy placed in the chart.

## 2015-10-17 NOTE — Progress Notes (Signed)
CSW met with [name] and potential adopting parents to attempt to collect requested documents for relinquishment of custody for the infant.  CSW reviewed documents with co-worker, CSW [employer].  It was determined that the copy of adoption forms that were presented to CSW by the potential adopting were not suffcient.  CSW requested attorney information to contact attorney in effort to obtain appropriate forms.   CSW left attorney, Eli Phillips, a voicemail message requesting a call back.  CSW Vidal Schwalbe, contacted hospital attorney to consult regarding documents. Dcuments from adopting parents were faxed over to hospital attorney for review.   Laurey Arrow, MSW, LCSW Clinical Social Work [phone]

## 2015-10-17 NOTE — Progress Notes (Signed)
LCSW has been consulted on case to assist with previous writer:Morgan Ramirez.  LCSW has consulted hospital attorneys due to most recent events with Morgan Ramirez no longer assisting adoptive family.  Current Plan:  LCSW consulted with Morgan Ramirez who did speak with new representation: Morgan Ramirez Attorney:  [phone] who is actively working on completing court order for adoption and paperwork can be sent over by 10:00am. Custody order and petition will be in hand on Tuesday by 10am.   Lennox LaityJodi has reviewed current paperwork from previous attorney and reports [name] has relinquished rights and documentation was not revoked.  In order to follow hospital discharge policy, [name] must sign document below.  [name] can be discharged this evening with completion of Discharge of Infant  other than Birth Mother.  Signature by [name] and Adoptive Parents is required.   LCSW has completed paperwork and will have [name] sign and make contact with Adoptive Parents to sign.    Hospital Attorney reports we can discharge [name] and if something changes in plan or if attorney fails to produce paperwork, then CPS will be have to be called.  LCSW will remain on case and RN has been updated along with MD.   Deretha EmoryHannah Kreig Parson LCSW, MSW Clinical Social Work: System Wide Float Coverage for [employer] N[facility] Clinical social worker [phone]

## 2015-10-17 NOTE — Progress Notes (Signed)
CSW spoke attorney, Herma MeringKevin Brackett via telephone.  CSW informed attorney of the required documents needed for infant to be dc to adopting parents.  Attorney communicated that "I will try to get neccessary paperwork over to the hospital today."  CSW expressed the urgency of the documents in effort to be sensitive to birth mother needs (birth mother was cleared for dc on 10/16/15).  CSW explained that birth [name] was cleared for dc on 10/16/15, however due to hospital not receiving required documents for relinquishment of rights or adoption paperwork, birth mother is unable to be dc.  CSW also explained to attorney that if birth mother leaves without requested documents for CSW, CSW will make a report to CPS for infant surrender.  Attorney communicated that the adopting parents does not want to get CPS involved.  Attorney asked CSW "what will happen if birth mother leaves hospital with baby and then gives baby to adopting parents. CSW informed attorney that CSW will make report to CPS due to birth mother not have any contact with baby and has failed to attach and bond with the baby prior to dc.  Attorney again communicated that the family is not interested in getting CPS involved.  Attorney will follow up with CSW prior to 4pm today.   Blaine HamperAngel Boyd-Gilyard, MSW, LCSW Clinical Social Work [phone]

## 2015-10-19 ENCOUNTER — Encounter: Payer: Self-pay | Admitting: Obstetrics & Gynecology

## 2015-10-22 ENCOUNTER — Encounter: Payer: Self-pay | Admitting: *Deleted

## 2015-11-22 ENCOUNTER — Encounter: Payer: Self-pay | Admitting: *Deleted

## 2015-11-22 ENCOUNTER — Ambulatory Visit: Payer: Self-pay | Admitting: Obstetrics and Gynecology

## 2015-11-22 NOTE — Progress Notes (Signed)
Morgan Ramirez missed a scheduled appointment for postpartum appointment . Letter sent.

## 2016-01-16 ENCOUNTER — Encounter: Payer: Self-pay | Admitting: *Deleted

## 2016-01-16 ENCOUNTER — Ambulatory Visit: Payer: Self-pay | Admitting: Certified Nurse Midwife

## 2016-01-16 NOTE — Progress Notes (Signed)
[facility]. [facility]. Per chart review was low risk pregnancy and BUFA. No need to reschedule unless patient calls per discussion with provider.

## 2016-06-22 ENCOUNTER — Inpatient Hospital Stay (HOSPITAL_COMMUNITY)
Admission: AD | Admit: 2016-06-22 | Discharge: 2016-06-22 | Disposition: A | Discharge status: Home / Self Care | Payer: Medicaid Other | Source: Ambulatory Visit | Attending: Obstetrics and Gynecology | Admitting: Obstetrics and Gynecology

## 2016-06-22 ENCOUNTER — Encounter (HOSPITAL_COMMUNITY): Payer: Self-pay | Admitting: *Deleted

## 2016-06-22 DIAGNOSIS — O9989 Other specified diseases and conditions complicating pregnancy, childbirth and the puerperium: Secondary | ICD-10-CM

## 2016-06-22 DIAGNOSIS — N760 Acute vaginitis: Secondary | ICD-10-CM

## 2016-06-22 DIAGNOSIS — B9689 Other specified bacterial agents as the cause of diseases classified elsewhere: Secondary | ICD-10-CM

## 2016-06-22 DIAGNOSIS — Z3A10 10 weeks gestation of pregnancy: Secondary | ICD-10-CM | POA: Insufficient documentation

## 2016-06-22 DIAGNOSIS — O23591 Infection of other part of genital tract in pregnancy, first trimester: Secondary | ICD-10-CM | POA: Insufficient documentation

## 2016-06-22 HISTORY — DX: Other specified bacterial agents as the cause of diseases classified elsewhere: B96.89

## 2016-06-22 HISTORY — DX: Acute vaginitis: N76.0

## 2016-06-22 LAB — WET PREP, GENITAL
SPERM: NONE SEEN
Trich, Wet Prep: NONE SEEN
Yeast Wet Prep HPF POC: NONE SEEN

## 2016-06-22 LAB — CBC
HEMATOCRIT: 30.6 % — AB (ref 36.0–46.0)
Hemoglobin: 9.9 g/dL — ABNORMAL LOW (ref 12.0–15.0)
MCH: 25.3 pg — ABNORMAL LOW (ref 26.0–34.0)
MCHC: 32.4 g/dL (ref 30.0–36.0)
MCV: 78.3 fL (ref 78.0–100.0)
Platelets: 402 10*3/uL — ABNORMAL HIGH (ref 150–400)
RBC: 3.91 MIL/uL (ref 3.87–5.11)
RDW: 15.6 % — AB (ref 11.5–15.5)
WBC: 13.2 10*3/uL — AB (ref 4.0–10.5)

## 2016-06-22 LAB — URINALYSIS, ROUTINE W REFLEX MICROSCOPIC
BILIRUBIN URINE: NEGATIVE
Bacteria, UA: NONE SEEN
GLUCOSE, UA: NEGATIVE mg/dL
Hgb urine dipstick: NEGATIVE
Ketones, ur: NEGATIVE mg/dL
Nitrite: NEGATIVE
PH: 6 (ref 5.0–8.0)
Protein, ur: NEGATIVE mg/dL
Specific Gravity, Urine: 1.024 (ref 1.005–1.030)

## 2016-06-22 LAB — POCT PREGNANCY, URINE: Preg Test, Ur: POSITIVE — AB

## 2016-06-22 MED ORDER — PRENATAL GUMMIES/DHA & FA 0.4-32.5 MG PO CHEW: 1.0000 | CHEWABLE_TABLET | Freq: Every day | ORAL | 12 refills | Status: DC

## 2016-06-22 MED ORDER — METRONIDAZOLE 500 MG PO TABS: 500.0000 mg | ORAL_TABLET | Freq: Two times a day (BID) | ORAL | 0 refills | Status: AC

## 2016-06-22 NOTE — MAU Provider Note (Signed)
History     CSN: [id number]  Arrival date and time: 06/22/16 1050   First Provider Initiated Contact with Patient 06/22/16 1240      Chief Complaint  Patient presents with  . Vaginal Discharge  . Threatened Miscarriage   Ms. Morgan Ramirez is a 26 yo G2P1001 at 10.[redacted] wks gestation presenting with complaints of foul-smelling vaginal discharge.  She thinks she had a miscarriage on 4/27, because she passed some blood clots and tissue at that time. Denies pain, VB or LOF. She has not started [facility], but desires to be seen at [facility]. H/O NSVD 8 months ago without any complications.  Vaginal Discharge  The patient's primary symptoms include a genital odor and vaginal discharge (yellow and foul smelling). This is a new problem. The current episode started in the past 7 days. The problem occurs constantly. The problem has been unchanged. The patient is experiencing no pain. She is pregnant. The vaginal discharge was malodorous and yellow. There has been no bleeding. She has not been passing clots (passed clot on 4/27). She has not been passing tissue (passed tissue-like glob 4/27). Nothing aggravates the symptoms. She has tried nothing for the symptoms. She is sexually active. It is unknown whether or not her partner has an STD. She uses nothing for contraception. Her menstrual history has been regular.     Past Medical History:  Diagnosis Date  . Bacterial vaginosis 06/22/2016  . Chronic back pain   . Trichimoniasis     Past Surgical History:  Procedure Laterality Date  . NO PAST SURGERIES      Family History  Problem Relation Age of Onset  . Adopted: Yes  . Family history unknown: Yes    Social History  Substance Use Topics  . Smoking status: Never Smoker  . Smokeless tobacco: Never Used  . Alcohol use No    Allergies: No Known Allergies  No prescriptions prior to admission.    Review of Systems  Constitutional: Negative.   HENT: Negative.   Eyes: Negative.   Respiratory:  Negative.   Cardiovascular: Negative.   Gastrointestinal: Negative.   Endocrine: Negative.   Genitourinary: Positive for vaginal discharge (yellow and foul smelling). Negative for vaginal bleeding.  Musculoskeletal: Negative.   Skin: Negative.   Allergic/Immunologic: Negative.   Neurological: Negative.   Hematological: Negative.   Psychiatric/Behavioral: Negative.    Results for orders placed or performed during the hospital encounter of 06/22/16 (from the past 24 hour(s))  Urinalysis, Routine w reflex microscopic     Status: Abnormal   Collection Time: 06/22/16 11:10 AM  Result Value Ref Range   Color, Urine YELLOW YELLOW   APPearance CLEAR CLEAR   Specific Gravity, Urine 1.024 1.005 - 1.030   pH 6.0 5.0 - 8.0   Glucose, UA NEGATIVE NEGATIVE mg/dL   Hgb urine dipstick NEGATIVE NEGATIVE   Bilirubin Urine NEGATIVE NEGATIVE   Ketones, ur NEGATIVE NEGATIVE mg/dL   Protein, ur NEGATIVE NEGATIVE mg/dL   Nitrite NEGATIVE NEGATIVE   Leukocytes, UA SMALL (A) NEGATIVE   RBC / HPF 0-5 0 - 5 RBC/hpf   WBC, UA 0-5 0 - 5 WBC/hpf   Bacteria, UA NONE SEEN NONE SEEN   Squamous Epithelial / LPF 0-5 (A) NONE SEEN   Mucous PRESENT   Pregnancy, urine POC     Status: Abnormal   Collection Time: 06/22/16 11:35 AM  Result Value Ref Range   Preg Test, Ur POSITIVE (A) NEGATIVE  Wet prep, genital  Status: Abnormal   Collection Time: 06/22/16 12:46 PM  Result Value Ref Range   Yeast Wet Prep HPF POC NONE SEEN NONE SEEN   Trich, Wet Prep NONE SEEN NONE SEEN   Clue Cells Wet Prep HPF POC PRESENT (A) NONE SEEN   WBC, Wet Prep HPF POC MANY (A) NONE SEEN   Sperm NONE SEEN   CBC     Status: Abnormal   Collection Time: 06/22/16 12:58 PM  Result Value Ref Range   WBC 13.2 (H) 4.0 - 10.5 K/uL   RBC 3.91 3.87 - 5.11 MIL/uL   Hemoglobin 9.9 (L) 12.0 - 15.0 g/dL   HCT 16.130.6 (L) 09.636.0 - 04.546.0 %   MCV 78.3 78.0 - 100.0 fL   MCH 25.3 (L) 26.0 - 34.0 pg   MCHC 32.4 30.0 - 36.0 g/dL   RDW 40.915.6 (H) 81.111.5 -  15.5 %   Platelets 402 (H) 150 - 400 K/uL   FHTs by doppler: 166 bpm  Physical Exam   Blood pressure (!) 108/59, pulse 67, temperature 98.4 F (36.9 C), temperature source Oral, resp. rate 16, height 5\' 3"  (1.6 m), weight 53.9 kg (118 lb 12 oz), last menstrual period 04/11/2016.  Physical Exam  Constitutional: She appears well-developed and well-nourished.  HENT:  Head: Normocephalic.  Eyes: Pupils are equal, round, and reactive to light.  Neck: Normal range of motion.  Cardiovascular: Normal rate, regular rhythm, normal heart sounds and intact distal pulses.   Respiratory: Effort normal and breath sounds normal.  GI: Soft. Bowel sounds are normal.  Genitourinary:  Genitourinary Comments: Slightly enlarged non-tender uterus, no CMT, moderate amount of watery yellow discharge noted in vaginal vault    MAU Course  Procedures  MDM CCUA UPT Wet Prep (+) clue cells GC/CT HIV  Assessment and Plan  26 yo G2P1001 at 10.[redacted] wks gestation Vaginal Discharge Bacterial Vaginosis  Discharge Home Instructions on BV and 1st Trimester pregnancy given Call to schedule appointment with [facility]  Raelyn Moraolitta Takirah Binford MSN, CNM 06/22/2016, 12:49 PM

## 2016-06-22 NOTE — MAU Note (Signed)
Pt presents to MAU with complaints of " I think I had a miscarriage" Pt states she had her last normal cycle in February. Passed some vaginal tissue on April the 27th while she was at work. Denies pain or VB at presents. Reports yellow foul vaginal discharge

## 2016-06-23 LAB — HIV ANTIBODY (ROUTINE TESTING W REFLEX): HIV Screen 4th Generation wRfx: NONREACTIVE

## 2016-06-25 LAB — GC/CHLAMYDIA PROBE AMP (~~LOC~~) NOT AT ARMC
CHLAMYDIA, DNA PROBE: NEGATIVE
NEISSERIA GONORRHEA: NEGATIVE

## 2016-08-05 ENCOUNTER — Encounter (HOSPITAL_COMMUNITY): Payer: Self-pay

## 2016-08-05 ENCOUNTER — Inpatient Hospital Stay (HOSPITAL_COMMUNITY)
Admission: AD | Admit: 2016-08-05 | Discharge: 2016-08-05 | Disposition: A | Discharge status: Home / Self Care | Payer: Self-pay | Source: Ambulatory Visit | Attending: Obstetrics & Gynecology | Admitting: Obstetrics & Gynecology

## 2016-08-05 DIAGNOSIS — N883 Incompetence of cervix uteri: Secondary | ICD-10-CM

## 2016-08-05 DIAGNOSIS — O3432 Maternal care for cervical incompetence, second trimester: Secondary | ICD-10-CM | POA: Insufficient documentation

## 2016-08-05 DIAGNOSIS — O99352 Diseases of the nervous system complicating pregnancy, second trimester: Secondary | ICD-10-CM | POA: Insufficient documentation

## 2016-08-05 DIAGNOSIS — O039 Complete or unspecified spontaneous abortion without complication: Secondary | ICD-10-CM | POA: Insufficient documentation

## 2016-08-05 DIAGNOSIS — Z3A16 16 weeks gestation of pregnancy: Secondary | ICD-10-CM | POA: Insufficient documentation

## 2016-08-05 DIAGNOSIS — O411220 Chorioamnionitis, second trimester, not applicable or unspecified: Secondary | ICD-10-CM | POA: Insufficient documentation

## 2016-08-05 HISTORY — DX: Incompetence of cervix uteri: N88.3

## 2016-08-05 LAB — CBC WITH DIFFERENTIAL/PLATELET
BASOS ABS: 0 10*3/uL (ref 0.0–0.1)
Basophils Relative: 0 %
EOS PCT: 1 %
Eosinophils Absolute: 0.1 10*3/uL (ref 0.0–0.7)
HCT: 30.9 % — ABNORMAL LOW (ref 36.0–46.0)
Hemoglobin: 10 g/dL — ABNORMAL LOW (ref 12.0–15.0)
LYMPHS PCT: 12 %
Lymphs Abs: 1.9 10*3/uL (ref 0.7–4.0)
MCH: 25.6 pg — ABNORMAL LOW (ref 26.0–34.0)
MCHC: 32.4 g/dL (ref 30.0–36.0)
MCV: 79 fL (ref 78.0–100.0)
Monocytes Absolute: 0.3 10*3/uL (ref 0.1–1.0)
Monocytes Relative: 2 %
NEUTROS ABS: 13.8 10*3/uL — AB (ref 1.7–7.7)
NEUTROS PCT: 85 %
PLATELETS: 384 10*3/uL (ref 150–400)
RBC: 3.91 MIL/uL (ref 3.87–5.11)
RDW: 15.7 % — ABNORMAL HIGH (ref 11.5–15.5)
WBC: 16 10*3/uL — AB (ref 4.0–10.5)

## 2016-08-05 LAB — RAPID URINE DRUG SCREEN, HOSP PERFORMED
Amphetamines: NOT DETECTED
BARBITURATES: NOT DETECTED
BENZODIAZEPINES: NOT DETECTED
Cocaine: NOT DETECTED
Opiates: NOT DETECTED
Tetrahydrocannabinol: NOT DETECTED

## 2016-08-05 LAB — TYPE AND SCREEN
ABO/RH(D): B POS
Antibody Screen: NEGATIVE

## 2016-08-05 MED ORDER — LACTATED RINGERS IV SOLN
INTRAVENOUS | Status: DC
  Administered 2016-08-05: 12:00:00 via INTRAVENOUS

## 2016-08-05 MED ORDER — MISOPROSTOL 200 MCG PO TABS
800.0000 ug | ORAL_TABLET | Freq: Once | ORAL | Status: AC
  Administered 2016-08-05: 800 ug via BUCCAL
  Filled 2016-08-05: qty 4

## 2016-08-05 NOTE — MAU Provider Note (Signed)
History     CSN: [id number]  Arrival date and time: 08/05/16 1133   First Provider Initiated Contact with Patient 08/05/16 1153      Chief Complaint  Patient presents with  . Miscarriage   HPI Morgan Ramirez 26 y.o. Comes in by EMS with a nonviable fetus delivered in route.  Baby still attached by the umbilical cord.  Has not had any prenatal care.  Was seen once in MAU on 06-22-16 thinking that she had a miscarriage.  Since that visit off and on, she has had vaginal bleeding - not severe.  Today had more pain and bleeding and thought she needed to have a BM.  OB History    Gravida Para Term Preterm AB Living   2 1 1     1    SAB TAB Ectopic Multiple Live Births         0 1      Obstetric Comments   Pt delivered in ambulance on way to hospital. Placenta was not delivered at time of arrival. Placenta delivered at 1255.      Past Medical History:  Diagnosis Date  . Bacterial vaginosis 06/22/2016  . Chronic back pain   . Trichimoniasis     Past Surgical History:  Procedure Laterality Date  . NO PAST SURGERIES      Family History  Problem Relation Age of Onset  . Adopted: Yes  . Family history unknown: Yes    Social History  Substance Use Topics  . Smoking status: Never Smoker  . Smokeless tobacco: Never Used  . Alcohol use No    Allergies: No Known Allergies  Prescriptions Prior to Admission  Medication Sig Dispense Refill Last Dose  . Prenatal MV-Min-FA-Omega-3 (PRENATAL GUMMIES/DHA & FA) 0.4-32.5 MG CHEW Chew 1 tablet by mouth daily. 30 tablet 12     Review of Systems  Constitutional: Negative for fever.  Gastrointestinal: Negative for nausea and vomiting.  Genitourinary: Positive for vaginal bleeding.   Physical Exam   Blood pressure 126/76, pulse (!) 104, temperature 98.4 F (36.9 C), resp. rate 18, last menstrual period 04/11/2016, unknown if currently breastfeeding.  Physical Exam  Nursing note and vitals reviewed. Constitutional: She is oriented  to person, place, and time. She appears well-developed and well-nourished.  HENT:  Head: Normocephalic.  Eyes: EOM are normal.  Neck: Neck supple.  GI: Soft. There is no tenderness.  Genitourinary:  Genitourinary Comments: Moderate red vaginal bleeding after the delivery of a nonviable fetus.    Musculoskeletal: Normal range of motion.  Neurological: She is alert and oriented to person, place, and time.  Skin: Skin is warm and dry.  Psychiatric: She has a normal mood and affect.    MAU Course  Procedures Results for orders placed or performed during the hospital encounter of 08/05/16 (from the past 24 hour(s))  CBC with Differential     Status: Abnormal   Collection Time: 08/05/16 12:28 PM  Result Value Ref Range   WBC 16.0 (H) 4.0 - 10.5 K/uL   RBC 3.91 3.87 - 5.11 MIL/uL   Hemoglobin 10.0 (L) 12.0 - 15.0 g/dL   HCT 40.9 (L) 81.1 - 91.4 %   MCV 79.0 78.0 - 100.0 fL   MCH 25.6 (L) 26.0 - 34.0 pg   MCHC 32.4 30.0 - 36.0 g/dL   RDW 78.2 (H) 95.6 - 21.3 %   Platelets 384 150 - 400 K/uL   Neutrophils Relative % 85 %   Neutro Abs 13.8 (H) 1.7 -  7.7 K/uL   Lymphocytes Relative 12 %   Lymphs Abs 1.9 0.7 - 4.0 K/uL   Monocytes Relative 2 %   Monocytes Absolute 0.3 0.1 - 1.0 K/uL   Eosinophils Relative 1 %   Eosinophils Absolute 0.1 0.0 - 0.7 K/uL   Basophils Relative 0 %   Basophils Absolute 0.0 0.0 - 0.1 K/uL  [facility]     Status: None   Collection Time: 08/05/16 12:31 PM  Result Value Ref Range   ABO/RH(D) B POS    Antibody Screen NEG    Sample Expiration 08/08/2016     MDM Consult with Dr. Macon LargeAnyanwu about the plan of care  - will use buccal Cytotec 800 mcg and IV fluids. 1250 cord clamps applied to the cord and cord was cut.  Placenta extracted with large clots.  Seems to be intact.  Will watch the client's bleeding.  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 Will discharge Message sent to clinic to schedule a follow up appointment in 2 weeks. No sex until seen in the clinic. Return for fever, worsening pain or severe vaginal bleeding.  Terri L Burleson 08/05/2016, 12:51 PM

## 2016-08-05 NOTE — MAU Note (Signed)
Pt got up to use the bathroom, was stable to walk, bleeding has slowed down and pt is feeling mild cramping.

## 2016-08-05 NOTE — Discharge Instructions (Signed)
The clinic will call you to make an appointment. It is important for you to be seen by a medical provider in 2 weeks. No sex until you have been seen by the doctor. Return sooner if you develop fever, worsening abdominal pain or severe vaginal bleeding.

## 2016-08-05 NOTE — MAU Note (Signed)
Pt came in EMS delivered, placenta still attached, moderate bleeding, no pain.

## 2016-08-06 LAB — RPR: RPR Ser Ql: NONREACTIVE

## 2016-08-06 LAB — HIV ANTIBODY (ROUTINE TESTING W REFLEX): HIV Screen 4th Generation wRfx: NONREACTIVE

## 2016-08-23 ENCOUNTER — Ambulatory Visit (INDEPENDENT_AMBULATORY_CARE_PROVIDER_SITE_OTHER): Payer: Self-pay | Admitting: Advanced Practice Midwife

## 2016-08-23 ENCOUNTER — Encounter: Payer: Self-pay | Admitting: Advanced Practice Midwife

## 2016-08-23 VITALS — BP 127/80 | HR 70 | Ht 63.0 in | Wt 126.7 lb

## 2016-08-23 DIAGNOSIS — Z3009 Encounter for other general counseling and advice on contraception: Secondary | ICD-10-CM

## 2016-08-23 DIAGNOSIS — O039 Complete or unspecified spontaneous abortion without complication: Secondary | ICD-10-CM

## 2016-08-23 MED ORDER — NORGESTIMATE-ETH ESTRADIOL 0.25-35 MG-MCG PO TABS: 1.0000 | ORAL_TABLET | Freq: Every day | ORAL | 11 refills | Status: DC

## 2016-08-23 NOTE — Patient Instructions (Signed)
Oral Contraception Use Oral contraceptive pills (OCPs) are medicines taken to prevent pregnancy. OCPs work by preventing the ovaries from releasing eggs. The hormones in OCPs also cause the cervical mucus to thicken, preventing the sperm from entering the uterus. The hormones also cause the uterine lining to become thin, not allowing a fertilized egg to attach to the inside of the uterus. OCPs are highly effective when taken exactly as prescribed. However, OCPs do not prevent sexually transmitted diseases (STDs). Safe sex practices, such as using condoms along with an OCP, can help prevent STDs. Before taking OCPs, you may have a physical exam and Pap test. Your health care provider may also order blood tests if necessary. Your health care provider will make sure you are a good candidate for oral contraception. Discuss with your health care provider the possible side effects of the OCP you may be prescribed. When starting an OCP, it can take 2 to 3 months for the body to adjust to the changes in hormone levels in your body. How to take oral contraceptive pills Your health care provider may advise you on how to start taking the first cycle of OCPs. Otherwise, you can:  Start on day 1 of your menstrual period. You will not need any backup contraceptive protection with this start time.  Start on the first Sunday after your menstrual period or the day you get your prescription. In these cases, you will need to use backup contraceptive protection for the first week.  Start the pill at any time of your cycle. If you take the pill within 5 days of the start of your period, you are protected against pregnancy right away. In this case, you will not need a backup form of birth control. If you start at any other time of your menstrual cycle, you will need to use another form of birth control for 7 days. If your OCP is the type called a minipill, it will protect you from pregnancy after taking it for 2 days (48  hours).  After you have started taking OCPs:  If you forget to take 1 pill, take it as soon as you remember. Take the next pill at the regular time.  If you miss 2 or more pills, call your health care provider because different pills have different instructions for missed doses. Use backup birth control until your next menstrual period starts.  If you use a 28-day pack that contains inactive pills and you miss 1 of the last 7 pills (pills with no hormones), it will not matter. Throw away the rest of the non-hormone pills and start a new pill pack.  No matter which day you start the OCP, you will always start a new pack on that same day of the week. Have an extra pack of OCPs and a backup contraceptive method available in case you miss some pills or lose your OCP pack. Follow these instructions at home:  Do not smoke.  Always use a condom to protect against STDs. OCPs do not protect against STDs.  Use a calendar to mark your menstrual period days.  Read the information and directions that came with your OCP. Talk to your health care provider if you have questions. Contact a health care provider if:  You develop nausea and vomiting.  You have abnormal vaginal discharge or bleeding.  You develop a rash.  You miss your menstrual period.  You are losing your hair.  You need treatment for mood swings or depression.  You   get dizzy when taking the OCP.  You develop acne from taking the OCP.  You become pregnant. Get help right away if:  You develop chest pain.  You develop shortness of breath.  You have an uncontrolled or severe headache.  You develop numbness or slurred speech.  You develop visual problems.  You develop pain, redness, and swelling in the legs. This information is not intended to replace advice given to you by your health care provider. Make sure you discuss any questions you have with your health care provider. Document Released: 01/25/2011 Document  Revised: 07/14/2015 Document Reviewed: 07/27/2012 Elsevier Interactive Patient Education  2017 [employer]vier Inc.  

## 2016-08-23 NOTE — Progress Notes (Signed)
Subjective:     Patient ID: Morgan Ramirez, female   DOB: 01/06/1991, 26 y.o.   MRN: [id number]  Morgan Ramirez is a 26 y.o. G2P1001 who is being seen today for FU after 16 week SAB.    Gynecologic Exam  The patient's primary symptoms include vaginal discharge (feels like it is normal discharge for her. ). The patient's pertinent negatives include no pelvic pain or vaginal bleeding. This is a new problem. The problem has been unchanged. The patient is experiencing no pain. She is not pregnant (recent SAB). Pertinent negatives include no chills, fever, nausea or vomiting. The vaginal discharge was normal. There has been no bleeding. Nothing aggravates the symptoms. She has tried nothing for the symptoms. She is sexually active. No, her partner does not have an STD. She uses nothing (considering options ) for contraception. Her menstrual history has been irregular (LMP 04/13/16 ). Her past medical history is significant for miscarriage.     Review of Systems  Constitutional: Negative for chills and fever.  Gastrointestinal: Negative for nausea and vomiting.  Genitourinary: Positive for vaginal discharge (feels like it is normal discharge for her. ). Negative for pelvic pain.       Objective:   Physical Exam  Constitutional: She is oriented to person, place, and time. She appears well-developed and well-nourished. No distress.  HENT:  Head: Normocephalic.  Cardiovascular: Normal rate.   Pulmonary/Chest: Effort normal.  Abdominal: Soft. There is no tenderness. There is no rebound.  Neurological: She is alert and oriented to person, place, and time.  Skin: Skin is warm and dry.  Psychiatric: She has a normal mood and affect.  Nursing note and vitals reviewed.      Assessment:     1. Spontaneous abortion   2. Encounter for counseling regarding contraception        Plan:    DC home Comfort measures reviewed  Bleeding precautions RX: Sprintec 28 as directed # with 11 RF  If  desires other options can return for Nexplanon or Depo. FU in 1 year for Pap Morgan Ramirez 10:24 AM 08/23/16

## 2017-06-04 ENCOUNTER — Encounter: Payer: Self-pay | Admitting: *Deleted

## 2020-06-07 ENCOUNTER — Other Ambulatory Visit: Payer: Self-pay

## 2020-06-07 ENCOUNTER — Ambulatory Visit (HOSPITAL_COMMUNITY)
Admission: EM | Admit: 2020-06-07 | Discharge: 2020-06-07 | Disposition: A | Discharge status: Home / Self Care | Payer: Self-pay | Attending: Student | Admitting: Student

## 2020-06-07 ENCOUNTER — Encounter (HOSPITAL_COMMUNITY): Payer: Self-pay

## 2020-06-07 DIAGNOSIS — A084 Viral intestinal infection, unspecified: Secondary | ICD-10-CM

## 2020-06-07 MED ORDER — ONDANSETRON 8 MG PO TBDP: 8.0000 mg | ORAL_TABLET | Freq: Three times a day (TID) | ORAL | 0 refills | Status: DC | PRN

## 2020-06-07 MED ORDER — LOPERAMIDE HCL 2 MG PO CAPS: 2.0000 mg | ORAL_CAPSULE | Freq: Four times a day (QID) | ORAL | 0 refills | Status: DC | PRN

## 2020-06-07 NOTE — ED Triage Notes (Signed)
Pt presents with abdominal discomfort when having nausea, vomiting, headaches x 3 days.

## 2020-06-07 NOTE — ED Provider Notes (Signed)
[facility]    CSN: [id number] Arrival date & time: 06/07/20  9833      History   Chief Complaint Chief Complaint  Patient presents with  . Abdominal Pain  . Nausea  . Emesis  . Headache    HPI Morgan Ramirez is a 30 y.o. female presenting with GI symptoms.  Medical history of chronic back pain, trichomoniasis, bacterial vaginosis. Notes 3 days of nausea with few episodes of vomiting daily. occasional diarrhea. Generalized crampy abdominal discomfort. Currently on her period, states she could not be pregnant, denies vaginal discharge. Denies cough, fevers/chills.  HPI  Past Medical History:  Diagnosis Date  . Bacterial vaginosis 06/22/2016  . Cervical incompetence 08/05/2016   SAB at [redacted]w[redacted]d  . Chronic back pain   . Trichimoniasis     Patient Active Problem List   Diagnosis Date Noted  . Cervical incompetence 08/05/2016    Past Surgical History:  Procedure Laterality Date  . NO PAST SURGERIES      OB History    Gravida  2   Para  1   Term  1   Preterm      AB      Living  1     SAB      IAB      Ectopic      Multiple  0   Live Births  1        Obstetric Comments  Pt delivered in ambulance on way to hospital. Placenta was not delivered at time of arrival. Placenta delivered at 1255.         Home Medications    Prior to Admission medications   Medication Sig Start Date End Date Taking? Authorizing Provider  loperamide (IMODIUM) 2 MG capsule Take 1 capsule (2 mg total) by mouth 4 (four) times daily as needed for diarrhea or loose stools. 06/07/20  Yes Rhys Martini, PA-C  ondansetron (ZOFRAN ODT) 8 MG disintegrating tablet Take 1 tablet (8 mg total) by mouth every 8 (eight) hours as needed for nausea or vomiting. 06/07/20  Yes Rhys Martini, PA-C  norgestimate-ethinyl estradiol (ORTHO-CYCLEN,SPRINTEC,PREVIFEM) 0.25-35 MG-MCG tablet Take 1 tablet by mouth daily. 08/23/16   Armando Reichert, CNM  Prenatal MV-Min-FA-Omega-3  (PRENATAL GUMMIES/DHA & FA) 0.4-32.5 MG CHEW Chew 2 each by mouth daily.    [provider]    Family History Family History  Adopted: Yes  Family history unknown: Yes    Social History Social History   Tobacco Use  . Smoking status: Current Every Day Smoker    Types: Cigarettes  . Smokeless tobacco: Never Used  Substance Use Topics  . Alcohol use: Yes    Alcohol/week: 5.0 standard drinks    Types: 5 Glasses of wine per week  . Drug use: No     Allergies   Patient has no known allergies.   Review of Systems Review of Systems  Constitutional: Negative for appetite change, chills, diaphoresis, fever and unexpected weight change.  HENT: Negative for congestion, ear pain, sinus pressure, sinus pain, sneezing, sore throat and trouble swallowing.   Respiratory: Negative for cough, chest tightness and shortness of breath.   Cardiovascular: Negative for chest pain.  Gastrointestinal: Positive for abdominal pain, diarrhea, nausea and vomiting. Negative for abdominal distention, anal bleeding, blood in stool, constipation and rectal pain.  Genitourinary: Negative for dysuria, flank pain, frequency and urgency.  Musculoskeletal: Negative for back pain and myalgias.  Neurological: Negative for dizziness, light-headedness and headaches.  All other systems reviewed and are negative.    Physical Exam Triage Vital Signs ED Triage Vitals  Enc Vitals Group     BP 06/07/20 0935 128/80     Pulse Rate 06/07/20 0935 (!) 57     Resp 06/07/20 0935 16     Temp 06/07/20 0935 98.8 F (37.1 C)     Temp Source 06/07/20 0935 Oral     SpO2 06/07/20 0935 100 %     Weight --      Height --      Head Circumference --      Peak Flow --      Pain Score 06/07/20 0937 8     Pain Loc --      Pain Edu? --      Excl. in GC? --    No data found.  Updated Vital Signs BP 128/80 (BP Location: Left Arm)   Pulse (!) 57   Temp 98.8 F (37.1 C) (Oral)   Resp 16   LMP 06/02/2020 (Exact  Date)   SpO2 100%   Visual Acuity Right Eye Distance:   Left Eye Distance:   Bilateral Distance:    Right Eye Near:   Left Eye Near:    Bilateral Near:     Physical Exam Vitals reviewed.  Constitutional:      General: She is not in acute distress.    Appearance: Normal appearance. She is not ill-appearing.  HENT:     Head: Normocephalic and atraumatic.     Mouth/Throat:     Mouth: Mucous membranes are moist.     Comments: Moist mucous membranes Eyes:     Extraocular Movements: Extraocular movements intact.     Pupils: Pupils are equal, round, and reactive to light.  Cardiovascular:     Rate and Rhythm: Normal rate and regular rhythm.     Heart sounds: Normal heart sounds.  Pulmonary:     Effort: Pulmonary effort is normal.     Breath sounds: Normal breath sounds. No wheezing, rhonchi or rales.  Abdominal:     General: Bowel sounds are increased. There is no distension.     Palpations: Abdomen is soft. There is no mass.     Tenderness: There is generalized abdominal tenderness. There is no right CVA tenderness, left CVA tenderness, guarding or rebound. Negative signs include Murphy's sign, Rovsing's sign and McBurney's sign.  Skin:    General: Skin is warm.     Capillary Refill: Capillary refill takes less than 2 seconds.     Comments: Good skin turgor  Neurological:     General: No focal deficit present.     Mental Status: She is alert and oriented to person, place, and time.  Psychiatric:        Mood and Affect: Mood normal.        Behavior: Behavior normal.      UC Treatments / Results  Labs (all labs ordered are listed, but only abnormal results are displayed) Labs Reviewed - No data to display  EKG   Radiology No results found.  Procedures Procedures (including critical care time)  Medications Ordered in UC Medications - No data to display  Initial Impression / Assessment and Plan / UC Course  I have reviewed the triage vital signs and the  nursing notes.  Pertinent labs & imaging results that were available during my care of the patient were reviewed by me and considered in my medical decision making (see chart for details).  This patient is a 30 year old female presenting with viral gastroenteritis and requesting work note. Today this pt is afebrile nontachycardic nontachypneic, oxygenating well on room air, no wheezes rhonchi or rales. Appears well hydrated.   Zofran and imodium sent.  Pt is currently on her period and states she could not be pregnant.  ED return precautions discussed.  Final Clinical Impressions(s) / UC Diagnoses   Final diagnoses:  Viral gastroenteritis     Discharge Instructions     -Take the Zofran (ondansetron) up to 3 times daily for nausea and vomiting. Dissolve one pill under your tongue or between your teeth and your cheek. -Take the Imodium (loperamide) up to 4 times daily for diarrhea. -Drink plenty of fluids and eat a bland diet as tolerated -Seek additional medical attention if your symptoms worsen despite treatment, like you're not able to keep fluids down; you develop severe abdominal pain; dizziness; etc.      ED Prescriptions    Medication Sig Dispense Auth. Provider   ondansetron (ZOFRAN ODT) 8 MG disintegrating tablet Take 1 tablet (8 mg total) by mouth every 8 (eight) hours as needed for nausea or vomiting. 20 tablet Rhys Martini, PA-C   loperamide (IMODIUM) 2 MG capsule Take 1 capsule (2 mg total) by mouth 4 (four) times daily as needed for diarrhea or loose stools. 12 capsule Rhys Martini, PA-C     PDMP not reviewed this encounter.   Rhys Martini, PA-C 06/07/20 1021

## 2020-06-07 NOTE — Discharge Instructions (Addendum)
-  Take the Zofran (ondansetron) up to 3 times daily for nausea and vomiting. Dissolve one pill under your tongue or between your teeth and your cheek. -Take the Imodium (loperamide) up to 4 times daily for diarrhea. -Drink plenty of fluids and eat a bland diet as tolerated -Seek additional medical attention if your symptoms worsen despite treatment, like you're not able to keep fluids down; you develop severe abdominal pain; dizziness; etc.

## 2020-10-18 ENCOUNTER — Other Ambulatory Visit: Payer: Self-pay

## 2020-10-18 ENCOUNTER — Emergency Department (HOSPITAL_COMMUNITY)
Admission: EM | Admit: 2020-10-18 | Discharge: 2020-10-19 | Disposition: A | Discharge status: Home / Self Care | Payer: BC Managed Care – PPO | Attending: Emergency Medicine | Admitting: Emergency Medicine

## 2020-10-18 ENCOUNTER — Encounter (HOSPITAL_COMMUNITY): Payer: Self-pay

## 2020-10-18 DIAGNOSIS — F17210 Nicotine dependence, cigarettes, uncomplicated: Secondary | ICD-10-CM | POA: Insufficient documentation

## 2020-10-18 DIAGNOSIS — F419 Anxiety disorder, unspecified: Secondary | ICD-10-CM | POA: Insufficient documentation

## 2020-10-18 DIAGNOSIS — R457 State of emotional shock and stress, unspecified: Secondary | ICD-10-CM | POA: Diagnosis not present

## 2020-10-18 DIAGNOSIS — R1084 Generalized abdominal pain: Secondary | ICD-10-CM | POA: Diagnosis not present

## 2020-10-18 DIAGNOSIS — Z5321 Procedure and treatment not carried out due to patient leaving prior to being seen by health care provider: Secondary | ICD-10-CM | POA: Insufficient documentation

## 2020-10-18 NOTE — ED Triage Notes (Signed)
Pt BIB EMS. Pt complains of anxiety and emotional distress. ETOH and marijuana use today.   Vitals 140/90 100 hr 100% room air  22 resp

## 2020-10-19 ENCOUNTER — Emergency Department (HOSPITAL_COMMUNITY)
Admission: EM | Admit: 2020-10-19 | Discharge: 2020-10-19 | Disposition: A | Discharge status: Home / Self Care | Payer: BC Managed Care – PPO | Source: Home / Self Care | Attending: Emergency Medicine | Admitting: Emergency Medicine

## 2020-10-19 ENCOUNTER — Other Ambulatory Visit: Payer: Self-pay

## 2020-10-19 DIAGNOSIS — F17210 Nicotine dependence, cigarettes, uncomplicated: Secondary | ICD-10-CM | POA: Insufficient documentation

## 2020-10-19 DIAGNOSIS — R112 Nausea with vomiting, unspecified: Secondary | ICD-10-CM | POA: Diagnosis not present

## 2020-10-19 DIAGNOSIS — R1084 Generalized abdominal pain: Secondary | ICD-10-CM | POA: Diagnosis not present

## 2020-10-19 DIAGNOSIS — F419 Anxiety disorder, unspecified: Secondary | ICD-10-CM

## 2020-10-19 DIAGNOSIS — R457 State of emotional shock and stress, unspecified: Secondary | ICD-10-CM | POA: Diagnosis not present

## 2020-10-19 MED ORDER — HYDROXYZINE HCL 25 MG PO TABS: 25.0000 mg | ORAL_TABLET | Freq: Four times a day (QID) | ORAL | 0 refills | Status: DC

## 2020-10-19 MED ORDER — HYDROXYZINE HCL 25 MG PO TABS
25.0000 mg | ORAL_TABLET | Freq: Once | ORAL | Status: AC
  Administered 2020-10-19: 25 mg via ORAL
  Filled 2020-10-19: qty 1

## 2020-10-19 NOTE — ED Provider Notes (Signed)
[facility]-EMERGENCY DEPT Provider Note   CSN: [id number] Arrival date & time: 10/19/20  0500   History Chief Complaint  Patient presents with   Anxiety    Morgan Ramirez is a 30 y.o. female.with no pertinent past medical history who presents to the emergency department for anxiety. States she has been dealing with anxiety for "2-3 years now," but that today was particularly difficult. She has rubble articulating what specifically was difficult about today.  States she was at home feeling overwhelmed.  States that "it can be scary to be at home alone."  When asked to elaborate she states that she is scared that she will will not be able to get to the phone if she needs help.  She also states some of her thoughts are scary however she does not elaborate further. She denies suicidal or homicidal ideations.  Denies chest pain, shortness of breath, palpitations. Denies seeing a psychiatrist or counselor before.  Endorses daily marijuana use.  She is tearful throughout the entire interview.   Anxiety  Past Medical History:  Diagnosis Date   Bacterial vaginosis 06/22/2016   Cervical incompetence 08/05/2016   SAB at [redacted]w[redacted]d   Chronic back pain    Trichimoniasis     Patient Active Problem List   Diagnosis Date Noted   Cervical incompetence 08/05/2016    Past Surgical History:  Procedure Laterality Date   NO PAST SURGERIES       OB History     Gravida  2   Para  1   Term  1   Preterm      AB      Living  1      SAB      IAB      Ectopic      Multiple  0   Live Births  1        Obstetric Comments  Pt delivered in ambulance on way to hospital. Placenta was not delivered at time of arrival. Placenta delivered at 1255.         Family History  Adopted: Yes  Family history unknown: Yes    Social History   Tobacco Use   Smoking status: Every Day    Types: Cigarettes   Smokeless tobacco: Never  Substance Use Topics   Alcohol use:  Yes    Alcohol/week: 5.0 standard drinks    Types: 5 Glasses of wine per week   Drug use: No    Home Medications Prior to Admission medications   Medication Sig Start Date End Date Taking? Authorizing Provider  loperamide (IMODIUM) 2 MG capsule Take 1 capsule (2 mg total) by mouth 4 (four) times daily as needed for diarrhea or loose stools. 06/07/20   Rhys Martini, PA-C  norgestimate-ethinyl estradiol (ORTHO-CYCLEN,SPRINTEC,PREVIFEM) 0.25-35 MG-MCG tablet Take 1 tablet by mouth daily. 08/23/16   Thressa Sheller D, CNM  ondansetron (ZOFRAN ODT) 8 MG disintegrating tablet Take 1 tablet (8 mg total) by mouth every 8 (eight) hours as needed for nausea or vomiting. 06/07/20   Rhys Martini, PA-C  Prenatal MV-Min-FA-Omega-3 (PRENATAL GUMMIES/DHA & FA) 0.4-32.5 MG CHEW Chew 2 each by mouth daily.    [provider]   Allergies    Patient has no known allergies.  Review of Systems   Review of Systems  Psychiatric/Behavioral:  Positive for sleep disturbance. Negative for hallucinations, self-injury and suicidal ideas. The patient is nervous/anxious.   All other systems reviewed and are negative.  Physical Exam  Updated Vital Signs BP 131/85 (BP Location: Right Arm)   Pulse 67   Temp 97.7 F (36.5 C) (Oral)   Resp 18   SpO2 100%   Physical Exam Vitals and nursing note reviewed.  Constitutional:      General: She is not in acute distress.    Appearance: She is not toxic-appearing.  HENT:     Head: Normocephalic.  Eyes:     Extraocular Movements: Extraocular movements intact.     Pupils: Pupils are equal, round, and reactive to light.  Cardiovascular:     Rate and Rhythm: Normal rate and regular rhythm.     Pulses: Normal pulses.     Heart sounds: Normal heart sounds.  Pulmonary:     Effort: Pulmonary effort is normal.     Breath sounds: Normal breath sounds.  Abdominal:     General: Abdomen is flat.  Musculoskeletal:     Cervical back: Normal range of motion and neck  supple.  Skin:    General: Skin is warm and dry.     Capillary Refill: Capillary refill takes less than 2 seconds.  Neurological:     General: No focal deficit present.     Mental Status: She is alert and oriented to person, place, and time.  Psychiatric:        Attention and Perception: Attention normal. She does not perceive auditory or visual hallucinations.        Mood and Affect: Mood is anxious. Affect is tearful.        Speech: Speech normal.        Behavior: Behavior is not agitated or aggressive. Behavior is cooperative.        Thought Content: Thought content normal. Thought content does not include homicidal or suicidal ideation.        Cognition and Memory: Cognition and memory normal.        Judgment: Judgment normal.   ED Results / Procedures / Treatments   Labs (all labs ordered are listed, but only abnormal results are displayed) Labs Reviewed - No data to display  EKG None  Radiology No results found.  Procedures Procedures   Medications Ordered in ED Medications  hydrOXYzine (ATARAX/VISTARIL) tablet 25 mg (25 mg Oral Given 10/19/20 0620)    ED Course  I have reviewed the triage vital signs and the nursing notes.  Pertinent labs & imaging results that were available during my care of the patient were reviewed by me and considered in my medical decision making (see chart for details).   MDM Rules/Calculators/A&P Presented to the emergency department for anxiety. Patient presents with symptoms that are consistent with an acute anxiety reaction.  I have low suspicion for acute cardiopulmonary process.  She denies any ingestions or other medical complaints on history or physical exam.  Given history and physical presentation is not consistent with overt toxidrome or ingestion.  Presentation is not consistent with a medical emergency at this time.  There is no indication for any acute psychiatric consultation as she has denied suicidal ideations/homicidal  ideations or auditory or visual hallucinations.  We discussed return precautions and she endorses full understanding.  Additionally we discussed the use of as needed hydroxyzine as she has been hesitant to use medication in the past.  She is agreeable to this plan at this time. Final Clinical Impression(s) / ED Diagnoses Final diagnoses:  Anxiety    Rx / DC Orders ED Discharge Orders  Ordered    hydrOXYzine (ATARAX/VISTARIL) 25 MG tablet  Every 6 hours        10/19/20 0613             Cristopher Peru, PA-C 10/19/20 [id number]    Geoffery Lyons, MD 10/19/20 [phone]

## 2020-10-19 NOTE — Discharge Instructions (Addendum)
You are seen in the emergency department today for anxiety. At this time I do not believe there to be an organic reason for your anxiety today. It is important that you follow-up with a counselor or psychiatrist to be evaluated and have ongoing care. We discussed the option of you taking Atarax as needed for your anxiety. I have prescribed you this medication. Take as prescribed. Please return to the emergency department for any reason. Particularly if you are having thoughts about hurting yourself or others. Thank you for trusting [place] with your care.

## 2020-10-19 NOTE — ED Triage Notes (Signed)
Patient was at home in her car complaining of being nauseas and vomiting due to be anxiety. Patient was here earlier and left.

## 2020-10-19 NOTE — ED Notes (Signed)
Introduced myself to patient. Patient stated she was feeling anxious "like her body was in a ball". Patient began to cry. I advised the provider would be in to speak with her soon.

## 2020-10-20 ENCOUNTER — Ambulatory Visit (HOSPITAL_COMMUNITY)
Admission: EM | Admit: 2020-10-20 | Discharge: 2020-10-20 | Disposition: A | Discharge status: Home / Self Care | Payer: No Payment, Other | Attending: Psychiatry | Admitting: Psychiatry

## 2020-10-20 ENCOUNTER — Other Ambulatory Visit: Payer: Self-pay

## 2020-10-20 DIAGNOSIS — F411 Generalized anxiety disorder: Secondary | ICD-10-CM | POA: Insufficient documentation

## 2020-10-20 DIAGNOSIS — Z599 Problem related to housing and economic circumstances, unspecified: Secondary | ICD-10-CM | POA: Insufficient documentation

## 2020-10-20 DIAGNOSIS — Z733 Stress, not elsewhere classified: Secondary | ICD-10-CM | POA: Insufficient documentation

## 2020-10-20 DIAGNOSIS — Z79899 Other long term (current) drug therapy: Secondary | ICD-10-CM | POA: Insufficient documentation

## 2020-10-20 NOTE — Discharge Instructions (Addendum)
Take Vistaril 25 mg 1/2 tablet (12.5 mg) by mouth for anxiety every 4-6 hours.  Take all of you medications as prescribed by your mental healthcare provider. Report any adverse effects and reactions from your medications to your outpatient provider promptly. Do not engage in alcohol and or illegal drug use while on prescription medicines.  Keep all scheduled appointments. This is to ensure that you are getting refills on time and to avoid any interruption in your medication. If you are unable to keep an appointment call to reschedule.  Be sure to follow up with resources and follow ups given.  In the event of worsening symptoms call the crisis hotline, 911, and or go to the nearest emergency department for appropriate evaluation and treatment of symptoms.  Follow-up with your primary care provider for your medical issues, concerns and or health care needs.

## 2020-10-20 NOTE — ED Provider Notes (Signed)
Behavioral Health Urgent Care Medical Screening Exam  Patient Name: Morgan Ramirez MRN: [id number] Date of Evaluation: 10/20/20 Chief Complaint:  reports worsening anxiety for the past couple of days. Pt reports anxiety to the point of having to call EMS due to difficulty breathing. Pt reports she was seen in ED twice for same reason. Pt was prescribed hydroxyzine at hospital and pt reports that medications made her feel drowsy and "has slowed down my motor".  Diagnosis:  Final diagnoses:  Generalized anxiety disorder    History of Present illness: Morgan Ramirez is a 30 y.o. female patient presents to the [facility] voluntarily as a walk-in unaccompanied with a chief complaint of anxiety.  On approach, the patient is alert and oriented x 4. Her thought process is logical, speech coherent and tone is normal. Her mood is euthymic and affect is congruent. She is causally dressed. She denies suicidal thoughts. She denies self harm behaviors. She  denies homicidal ideations. She denies AVH. She does not appear to be responding to internal, or external stimuli.    She reports having a lot of anxiety since the end of 2020. She state that she was given Vistaril for anxiety when she went to the ED yesterday, but doesn't  like the way the medication makes her feel. She states that she took the medication this morning while she was at work and she felt drowsy afterwards.  She reports having frequent anxiety and describes her symptoms as hard to catch her breath, heart beating fast, tremors, and excessive worrying. She identifies stressors to her anxiety as worrying about paying bills and states that she is behind on her rent  $4000.  She states that she is trying to stay positive.  She reports fair sleep. She reports that her appetite varies. She states that she believes she needs therapy but does not want to do therapy or take medications. She states that she is aware that  she needs to do something because she is unable to cope with her anxiety. We discussed her decreasing the dose of Vistaril from 25 mg every 6 hours as needed to taking a half a tablet 12.5 mg po every 4-6 hours as needed for anxiety.  She verbalizes understanding of the stated medication regimen. We discussed her following up at the [facility] for medication management to start a long-term medication for anxiety and therapy. She verbalizes understanding. She was provided with [facility] hours for medication management and therapy.    Psychiatric Specialty Exam  Presentation  General Appearance:Appropriate for Environment  Eye Contact:Fair  Speech:Clear and Coherent  Speech Volume:Normal  Handedness:Right   Mood and Affect  Mood:Euthymic  Affect:Appropriate   Thought Process  Thought Processes:Coherent  Descriptions of Associations:Intact  Orientation:Full (Time, Place and Person)  Thought Content:Logical    Hallucinations:None  Ideas of Reference:None  Suicidal Thoughts:No  Homicidal Thoughts:No   Sensorium  Memory:Immediate Fair; Recent Fair; Remote Fair  Judgment:Fair  Insight:Fair   Executive Functions  Concentration:Fair  Attention Span:Fair  Recall:Fair  Fund of Knowledge:Fair  Language:Fair   Psychomotor Activity  Psychomotor Activity:Normal   Assets  Assets:Communication Skills; Desire for Improvement; Financial Resources/Insurance; Leisure Time; Physical Health   Sleep  Sleep:Fair  Number of hours: 8   No data recorded  Physical Exam: Physical Exam HENT:     Head: Normocephalic.     Nose: Nose normal.  Eyes:     Conjunctiva/sclera: Conjunctivae normal.  Cardiovascular:  Rate and Rhythm: Normal rate.  Pulmonary:     Effort: Pulmonary effort is normal.  Musculoskeletal:        General: Normal range of motion.     Cervical back: Normal range of motion.  Neurological:     Mental  Status: She is alert and oriented to person, place, and time.   Review of Systems  Constitutional: Negative.   HENT: Negative.    Eyes: Negative.   Respiratory: Negative.    Cardiovascular: Negative.   Gastrointestinal: Negative.   Genitourinary: Negative.   Musculoskeletal: Negative.   Skin: Negative.   Neurological: Negative.   Endo/Heme/Allergies: Negative.   Psychiatric/Behavioral:  The patient is nervous/anxious.   Blood pressure 119/84, pulse 61, temperature 98.9 F (37.2 C), temperature source Oral, resp. rate 18, SpO2 100 %, unknown if currently breastfeeding. There is no height or weight on file to calculate BMI.  Musculoskeletal: Strength & Muscle Tone: within normal limits Gait & Station: normal Patient leans: N/A   [facility] MSE Discharge Disposition for Follow up and Recommendations: Based on my evaluation the patient does not appear to have an emergency medical condition and can be discharged with resources and follow up care in outpatient services for Medication Management, Individual Therapy, and Group Therapy   Layla Barter, NP 10/20/2020, 6:39 PM
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
## 2020-10-21 ENCOUNTER — Ambulatory Visit (INDEPENDENT_AMBULATORY_CARE_PROVIDER_SITE_OTHER): Payer: No Payment, Other | Admitting: Physician Assistant

## 2020-10-21 ENCOUNTER — Encounter (HOSPITAL_COMMUNITY): Payer: Self-pay | Admitting: Physician Assistant

## 2020-10-21 VITALS — BP 112/61 | HR 63 | Ht 63.0 in | Wt 133.0 lb

## 2020-10-21 DIAGNOSIS — F411 Generalized anxiety disorder: Secondary | ICD-10-CM

## 2020-10-21 DIAGNOSIS — F331 Major depressive disorder, recurrent, moderate: Secondary | ICD-10-CM | POA: Diagnosis not present

## 2020-10-21 MED ORDER — HYDROXYZINE HCL 10 MG PO TABS: 10.0000 mg | ORAL_TABLET | Freq: Four times a day (QID) | ORAL | 1 refills | Status: DC | PRN
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
## 2020-12-12 ENCOUNTER — Encounter (HOSPITAL_COMMUNITY): Payer: BC Managed Care – PPO | Admitting: Family

## 2020-12-13 ENCOUNTER — Encounter (HOSPITAL_COMMUNITY): Payer: No Payment, Other | Admitting: Physician Assistant

## 2020-12-22 ENCOUNTER — Ambulatory Visit (HOSPITAL_COMMUNITY): Payer: No Payment, Other | Admitting: Licensed Clinical Social Worker

## 2020-12-30 DIAGNOSIS — Z114 Encounter for screening for human immunodeficiency virus [HIV]: Secondary | ICD-10-CM | POA: Diagnosis not present

## 2020-12-30 DIAGNOSIS — Z113 Encounter for screening for infections with a predominantly sexual mode of transmission: Secondary | ICD-10-CM | POA: Diagnosis not present

## 2020-12-30 DIAGNOSIS — Z01419 Encounter for gynecological examination (general) (routine) without abnormal findings: Secondary | ICD-10-CM | POA: Diagnosis not present

## 2021-06-28 DIAGNOSIS — Z113 Encounter for screening for infections with a predominantly sexual mode of transmission: Secondary | ICD-10-CM | POA: Diagnosis not present

## 2021-06-28 DIAGNOSIS — Z114 Encounter for screening for human immunodeficiency virus [HIV]: Secondary | ICD-10-CM | POA: Diagnosis not present

## 2021-07-06 ENCOUNTER — Encounter (HOSPITAL_COMMUNITY): Payer: Self-pay

## 2021-07-06 ENCOUNTER — Emergency Department (HOSPITAL_COMMUNITY)
Admission: EM | Admit: 2021-07-06 | Discharge: 2021-07-06 | Disposition: A | Discharge status: Home / Self Care | Payer: BC Managed Care – PPO | Attending: Emergency Medicine | Admitting: Emergency Medicine

## 2021-07-06 DIAGNOSIS — N3001 Acute cystitis with hematuria: Secondary | ICD-10-CM | POA: Diagnosis not present

## 2021-07-06 DIAGNOSIS — Z87891 Personal history of nicotine dependence: Secondary | ICD-10-CM | POA: Diagnosis not present

## 2021-07-06 DIAGNOSIS — R112 Nausea with vomiting, unspecified: Secondary | ICD-10-CM | POA: Diagnosis not present

## 2021-07-06 DIAGNOSIS — R42 Dizziness and giddiness: Secondary | ICD-10-CM | POA: Diagnosis not present

## 2021-07-06 DIAGNOSIS — R630 Anorexia: Secondary | ICD-10-CM | POA: Diagnosis not present

## 2021-07-06 DIAGNOSIS — R1013 Epigastric pain: Secondary | ICD-10-CM | POA: Diagnosis not present

## 2021-07-06 DIAGNOSIS — R1111 Vomiting without nausea: Secondary | ICD-10-CM | POA: Diagnosis not present

## 2021-07-06 DIAGNOSIS — R1110 Vomiting, unspecified: Secondary | ICD-10-CM

## 2021-07-06 DIAGNOSIS — R252 Cramp and spasm: Secondary | ICD-10-CM | POA: Diagnosis not present

## 2021-07-06 LAB — URINALYSIS, ROUTINE W REFLEX MICROSCOPIC
Bilirubin Urine: NEGATIVE
Glucose, UA: NEGATIVE mg/dL
Ketones, ur: 80 mg/dL — AB
Nitrite: NEGATIVE
Protein, ur: 30 mg/dL — AB
Specific Gravity, Urine: 1.024 (ref 1.005–1.030)
pH: 6 (ref 5.0–8.0)

## 2021-07-06 LAB — CBG MONITORING, ED: Glucose-Capillary: 85 mg/dL (ref 70–99)

## 2021-07-06 LAB — BASIC METABOLIC PANEL
Anion gap: 9 (ref 5–15)
BUN: 13 mg/dL (ref 6–20)
CO2: 21 mmol/L — ABNORMAL LOW (ref 22–32)
Calcium: 9.1 mg/dL (ref 8.9–10.3)
Chloride: 105 mmol/L (ref 98–111)
Creatinine, Ser: 0.83 mg/dL (ref 0.44–1.00)
GFR, Estimated: >60 mL/min (ref >60)
Glucose, Bld: 84 mg/dL (ref 70–99)
Potassium: 3.9 mmol/L (ref 3.5–5.1)
Sodium: 135 mmol/L (ref 135–145)

## 2021-07-06 LAB — CBC
HCT: 42.4 % (ref 36.0–46.0)
Hemoglobin: 13.9 g/dL (ref 12.0–15.0)
MCH: 28.3 pg (ref 26.0–34.0)
MCHC: 32.8 g/dL (ref 30.0–36.0)
MCV: 86.2 fL (ref 80.0–100.0)
Platelets: 341 10*3/uL (ref 150–400)
RBC: 4.92 MIL/uL (ref 3.87–5.11)
RDW: 13.5 % (ref 11.5–15.5)
WBC: 10.3 10*3/uL (ref 4.0–10.5)
nRBC: 0 % (ref 0.0–0.2)

## 2021-07-06 LAB — I-STAT BETA HCG BLOOD, ED (MC, WL, AP ONLY): I-stat hCG, quantitative: <5 m[IU]/mL (ref <5)

## 2021-07-06 LAB — LIPASE, BLOOD: Lipase: 31 U/L (ref 11–51)

## 2021-07-06 MED ORDER — ONDANSETRON 4 MG PO TBDP
4.0000 mg | ORAL_TABLET | Freq: Once | ORAL | Status: AC
  Administered 2021-07-06: 4 mg via ORAL
  Filled 2021-07-06: qty 1

## 2021-07-06 MED ORDER — ONDANSETRON HCL 4 MG PO TABS
4.0000 mg | ORAL_TABLET | Freq: Four times a day (QID) | ORAL | 0 refills | Status: DC
Start: 2021-07-06 — End: 2022-01-04

## 2021-07-06 MED ORDER — LACTATED RINGERS IV BOLUS
1000.0000 mL | Freq: Once | INTRAVENOUS | Status: AC
  Administered 2021-07-06: 1000 mL via INTRAVENOUS

## 2021-07-06 MED ORDER — NITROFURANTOIN MONOHYD MACRO 100 MG PO CAPS: 100.0000 mg | ORAL_CAPSULE | Freq: Two times a day (BID) | ORAL | 0 refills | Status: DC

## 2021-07-06 NOTE — Discharge Instructions (Signed)
Your labs are overall very reassuring.  It does show that you have a urinary tract infection and I have sent in antibiotics to treat this.  Additionally I have sent in some Zofran which you can take should she feel nauseous or having trouble eating.  Continue to drink plenty of water.  If symptoms worsen, return to the ED otherwise you should feel better in a few days.

## 2021-07-06 NOTE — ED Triage Notes (Signed)
Pt BIBA from home for loss of appetite since Tuesday. Has just had water to drink. Denies NVD. Tried to eat but wouldn't go down. This morning had brief episode of dizziness and lost grip, grip returned. Hx anxiety, doesn't take meds d/t not liking how they make her feel.  118/70 HR 62 99% RA CBG 99

## 2021-07-06 NOTE — ED Provider Notes (Signed)
[facility]-EMERGENCY DEPT Provider Note   CSN: [id number] Arrival date & time: 07/06/21  0708     History  Chief Complaint  Patient presents with   Anorexia    Morgan Ramirez is a 31 y.o. female who presents to the ED for evaluation of decreased appetite, nausea and vomiting that began 2 days ago.  She states that she is able to keep fluids down, however has trouble keeping food down.  She states that she feels very hungry, however when she starts chewing on food, she feels that she cannot swallow it because she is afraid she is going to throw it back up.  She has been drinking water.  She endorses some mild epigastric abdominal pain described as soreness.  Earlier today, she experienced an episode of lightheadedness while standing and felt as if and spasmed and contracted and was unable to move it for a few seconds before normal motor function returned.  Denies numbness and tingling.  Denies fevers, chest pain, shortness of breath, diarrhea.  No new restaurants or known food ingestions. HPI     Home Medications Prior to Admission medications   Medication Sig Start Date End Date Taking? Authorizing Provider  nitrofurantoin, macrocrystal-monohydrate, (MACROBID) 100 MG capsule Take 1 capsule (100 mg total) by mouth 2 (two) times daily. 07/06/21  Yes Raynald Blendonklin, Williamson Cavanah R, PA-C  ondansetron (ZOFRAN) 4 MG tablet Take 1 tablet (4 mg total) by mouth every 6 (six) hours. 07/06/21  Yes Raynald Blendonklin, Saber Dickerman R, PA-C  hydrOXYzine (ATARAX/VISTARIL) 10 MG tablet Take 1 tablet (10 mg total) by mouth every 6 (six) hours as needed. 10/21/20   Nwoko, Tommas OlpUchenna E, PA  norgestimate-ethinyl estradiol (ORTHO-CYCLEN,SPRINTEC,PREVIFEM) 0.25-35 MG-MCG tablet Take 1 tablet by mouth daily. 08/23/16   Armando ReichertHogan, Heather D, CNM  Prenatal MV-Min-FA-Omega-3 (PRENATAL GUMMIES/DHA & FA) 0.4-32.5 MG CHEW Chew 2 each by mouth daily.    [provider]      Allergies    Patient has no known allergies.     Review of Systems   Review of Systems  Physical Exam Updated Vital Signs BP 131/74   Pulse 70   Temp 98.4 F (36.9 C) (Oral)   Resp 16   Ht 5\' 3"  (1.6 m)   Wt 61.2 kg   SpO2 94%   BMI 23.91 kg/m  Physical Exam Vitals and nursing note reviewed.  Constitutional:      General: She is not in acute distress.    Appearance: She is not ill-appearing.  HENT:     Head: Atraumatic.     Mouth/Throat:     Mouth: Mucous membranes are moist.  Eyes:     Conjunctiva/sclera: Conjunctivae normal.  Cardiovascular:     Rate and Rhythm: Normal rate and regular rhythm.     Pulses: Normal pulses.     Heart sounds: No murmur heard. Pulmonary:     Effort: Pulmonary effort is normal. No respiratory distress.     Breath sounds: Normal breath sounds.  Abdominal:     General: Abdomen is flat. There is no distension.     Palpations: Abdomen is soft.     Tenderness: There is no abdominal tenderness.     Comments: Abdomen soft not obviously tender to palpation.  Negative CVA tenderness  Musculoskeletal:        General: Normal range of motion.     Cervical back: Normal range of motion.  Skin:    General: Skin is warm and dry.     Capillary Refill:  Capillary refill takes less than 2 seconds.  Neurological:     General: No focal deficit present.     Mental Status: She is alert.  Psychiatric:        Mood and Affect: Mood normal.    ED Results / Procedures / Treatments   Labs (all labs ordered are listed, but only abnormal results are displayed) Labs Reviewed  BASIC METABOLIC PANEL - Abnormal; Notable for the following components:      Result Value   CO2 21 (*)    All other components within normal limits  URINALYSIS, ROUTINE W REFLEX MICROSCOPIC - Abnormal; Notable for the following components:   APPearance HAZY (*)    Hgb urine dipstick MODERATE (*)    Ketones, ur 80 (*)    Protein, ur 30 (*)    Leukocytes,Ua TRACE (*)    Bacteria, UA FEW (*)    All other components within normal  limits  CBC  LIPASE, BLOOD  CBG MONITORING, ED  I-STAT BETA HCG BLOOD, ED ([facility], [facility], [facility] ONLY)    EKG None  Radiology No results found.  Procedures Procedures    Medications Ordered in ED Medications  lactated ringers bolus 1,000 mL (1,000 mLs Intravenous New Bag/Given (Non-Interop) 07/06/21 7078)  ondansetron (ZOFRAN-ODT) disintegrating tablet 4 mg (4 mg Oral Given 07/06/21 6754)    ED Course/ Medical Decision Making/ A&P                           Medical Decision Making Amount and/or Complexity of Data Reviewed Labs: ordered.  Risk Prescription drug management.   Social determinants of health:  Social History   Socioeconomic History   Marital status: Single    Spouse name: Not on file   Number of children: Not on file   Years of education: Not on file   Highest education level: Not on file  Occupational History   Not on file  Tobacco Use   Smoking status: Former    Types: Cigarettes   Smokeless tobacco: Never  Substance and Sexual Activity   Alcohol use: Yes    Alcohol/week: 5.0 standard drinks    Types: 5 Glasses of wine per week   Drug use: Yes    Types: Marijuana    Comment: daily   Sexual activity: Yes    Birth control/protection: None  Other Topics Concern   Not on file  Social History Narrative   Not on file   Social Determinants of Health   Financial Resource Strain: Not on file  Food Insecurity: Not on file  Transportation Needs: Not on file  Physical Activity: Not on file  Stress: Not on file  Social Connections: Not on file  Intimate Partner Violence: Not on file   Advised to discontinue marijuana use of this can cause vomiting  Initial impression:  This patient presents to the ED for concern of inappetence and vomiting, this involves an extensive number of treatment options, and is a complaint that carries with it a high risk of complications and morbidity.   Differentials include pregnancy, SBO, electrolyte abnormality, UTI acute  gastroenteritis.   Comorbidities affecting care:  None  Lab Tests  I Ordered, reviewed, and interpreted labs and EKG.  The pertinent results include:  BMP normal Negative pregnancy, negative lipase CBC without leukocytosis UA with evidence of infection  EKG: Normal sinus rhythm  Medicines ordered and prescription drug management:  I ordered medication including: 1 L LR bolus Zofran  4 mg Reevaluation of the patient after these medicines showed that the patient resolved I have reviewed the patients home medicines and have made adjustments as needed   ED Course/Re-evaluation: 31 year old female in no acute distress, nontoxic-appearing presents to the ED for decreased appetite and vomiting for the last.  Vitals without significant abnormality.  On physical exam, her abdomen is soft, nondistended and overall nontender to palpation.  This decreases concern for possible SBO, so I did not order CT imaging.  Remainder physical exam benign.  I ordered basic labs and imaging to rule out pregnancy, electrolyte abnormality which all returned normal.  However patient did have evidence of urinary tract infection on labs.  There was a small degree of contamination given 11-20 squamous cells, however with the presence of leukocytes and moderate amounts of blood, I believe treatment is indicated.  Given that patient has been vomiting and unable to eat for the last 2 days, I did order 1 L LR bolus along with Zofran 4 mg.  I did p.o. trial and patient was able to keep applesauce down and reports feeling much better.  She states that she feels "steadier on her feet" and feels ready for discharge.  Given her positive response to treatment, will discharge home with antibiotics for UTI and Zofran for nausea.  Disposition:  After consideration of the diagnostic results, physical exam, history and the patients response to treatment feel that the patent would benefit from discharge.   UTI Vomiting: Plan and  management as described above. Discharged home in good condition.   Final Clinical Impression(s) / ED Diagnoses Final diagnoses:  Acute cystitis with hematuria  Vomiting, unspecified vomiting type, unspecified whether nausea present    Rx / DC Orders ED Discharge Orders          Ordered    ondansetron (ZOFRAN) 4 MG tablet  Every 6 hours        07/06/21 0939    nitrofurantoin, macrocrystal-monohydrate, (MACROBID) 100 MG capsule  2 times daily        07/06/21 0939              Janell Quiet, PA-C 07/06/21 0940    Benjiman Core, MD 07/06/21 1531

## 2021-07-06 NOTE — ED Notes (Signed)
Provided pt with applesauce per request

## 2021-07-09 ENCOUNTER — Encounter (HOSPITAL_COMMUNITY): Payer: Self-pay

## 2021-07-09 ENCOUNTER — Other Ambulatory Visit: Payer: Self-pay

## 2021-07-09 ENCOUNTER — Emergency Department (HOSPITAL_COMMUNITY)
Admission: EM | Admit: 2021-07-09 | Discharge: 2021-07-09 | Disposition: A | Discharge status: Home / Self Care | Payer: BC Managed Care – PPO | Attending: Student | Admitting: Student

## 2021-07-09 DIAGNOSIS — R531 Weakness: Secondary | ICD-10-CM | POA: Diagnosis not present

## 2021-07-09 DIAGNOSIS — F419 Anxiety disorder, unspecified: Secondary | ICD-10-CM | POA: Insufficient documentation

## 2021-07-09 DIAGNOSIS — F12120 Cannabis abuse with intoxication, uncomplicated: Secondary | ICD-10-CM | POA: Diagnosis not present

## 2021-07-09 DIAGNOSIS — R000 Tachycardia, unspecified: Secondary | ICD-10-CM | POA: Diagnosis not present

## 2021-07-09 DIAGNOSIS — F12920 Cannabis use, unspecified with intoxication, uncomplicated: Secondary | ICD-10-CM

## 2021-07-09 LAB — CBC WITH DIFFERENTIAL/PLATELET
Abs Immature Granulocytes: 0.04 10*3/uL (ref 0.00–0.07)
Basophils Absolute: 0 10*3/uL (ref 0.0–0.1)
Basophils Relative: 0 %
Eosinophils Absolute: 0.1 10*3/uL (ref 0.0–0.5)
Eosinophils Relative: 1 %
HCT: 43.5 % (ref 36.0–46.0)
Hemoglobin: 13.9 g/dL (ref 12.0–15.0)
Immature Granulocytes: 0 %
Lymphocytes Relative: 31 %
Lymphs Abs: 3.4 10*3/uL (ref 0.7–4.0)
MCH: 27.6 pg (ref 26.0–34.0)
MCHC: 32 g/dL (ref 30.0–36.0)
MCV: 86.5 fL (ref 80.0–100.0)
Monocytes Absolute: 0.6 10*3/uL (ref 0.1–1.0)
Monocytes Relative: 6 %
Neutro Abs: 6.7 10*3/uL (ref 1.7–7.7)
Neutrophils Relative %: 62 %
Platelets: 355 10*3/uL (ref 150–400)
RBC: 5.03 MIL/uL (ref 3.87–5.11)
RDW: 13.4 % (ref 11.5–15.5)
WBC: 11 10*3/uL — ABNORMAL HIGH (ref 4.0–10.5)
nRBC: 0 % (ref 0.0–0.2)

## 2021-07-09 LAB — COMPREHENSIVE METABOLIC PANEL
ALT: 16 U/L (ref 0–44)
AST: 19 U/L (ref 15–41)
Albumin: 4.4 g/dL (ref 3.5–5.0)
Alkaline Phosphatase: 53 U/L (ref 38–126)
Anion gap: 6 (ref 5–15)
BUN: 9 mg/dL (ref 6–20)
CO2: 24 mmol/L (ref 22–32)
Calcium: 9.5 mg/dL (ref 8.9–10.3)
Chloride: 110 mmol/L (ref 98–111)
Creatinine, Ser: 0.86 mg/dL (ref 0.44–1.00)
GFR, Estimated: >60 mL/min (ref >60)
Glucose, Bld: 121 mg/dL — ABNORMAL HIGH (ref 70–99)
Potassium: 4 mmol/L (ref 3.5–5.1)
Sodium: 140 mmol/L (ref 135–145)
Total Bilirubin: 0.5 mg/dL (ref 0.3–1.2)
Total Protein: 8.2 g/dL — ABNORMAL HIGH (ref 6.5–8.1)

## 2021-07-09 LAB — TROPONIN I (HIGH SENSITIVITY): Troponin I (High Sensitivity): <2 ng/L (ref <18)

## 2021-07-09 MED ORDER — LORAZEPAM 1 MG PO TABS
1.0000 mg | ORAL_TABLET | Freq: Once | ORAL | Status: AC
  Administered 2021-07-09: 1 mg via ORAL
  Filled 2021-07-09: qty 1

## 2021-07-09 NOTE — ED Provider Notes (Signed)
[facility]-EMERGENCY DEPT Provider Note   CSN: [id number] Arrival date & time: 07/09/21  1935     History {Add pertinent medical, surgical, social history, OB history to HPI:1} Chief Complaint  Patient presents with   Tachycardia    Morgan Ramirez is a 31 y.o. female.  HPI Patient states that she felt uncomfortable after smoking marijuana today, noticed her weakness, left side of her body and felt like she was talking unusually.  She decided to drive here for evaluation.  She has hydroxyzine to take for anxiety but thinks it is "too strong," so does not take it.  She is currently taking the antibiotic, prescribed recently for UTI.  She works as a Production designer, theatre/television/film.    Home Medications Prior to Admission medications   Medication Sig Start Date End Date Taking? Authorizing Provider  hydrOXYzine (ATARAX/VISTARIL) 10 MG tablet Take 1 tablet (10 mg total) by mouth every 6 (six) hours as needed. 10/21/20   Nwoko, Tommas Olp, PA  nitrofurantoin, macrocrystal-monohydrate, (MACROBID) 100 MG capsule Take 1 capsule (100 mg total) by mouth 2 (two) times daily. 07/06/21   Janell Quiet, PA-C  norgestimate-ethinyl estradiol (ORTHO-CYCLEN,SPRINTEC,PREVIFEM) 0.25-35 MG-MCG tablet Take 1 tablet by mouth daily. 08/23/16   Thressa Sheller D, CNM  ondansetron (ZOFRAN) 4 MG tablet Take 1 tablet (4 mg total) by mouth every 6 (six) hours. 07/06/21   Janell Quiet, PA-C  Prenatal MV-Min-FA-Omega-3 (PRENATAL GUMMIES/DHA & FA) 0.4-32.5 MG CHEW Chew 2 each by mouth daily.    [provider]      Allergies    Patient has no known allergies.    Review of Systems   Review of Systems  Physical Exam Updated Vital Signs BP 115/68   Pulse 74   Temp 97.9 F (36.6 C) (Oral)   Resp 18   Ht 5\' 3"  (1.6 m)   Wt 61.2 kg   SpO2 99%   BMI 23.91 kg/m  Physical Exam Vitals and nursing note reviewed.  Constitutional:      General: She is not in acute distress.    Appearance: She is  well-developed. She is not ill-appearing, toxic-appearing or diaphoretic.     Comments: Patient evaluated by me 40 minutes after she was treated with Ativan.  HENT:     Head: Normocephalic and atraumatic.     Right Ear: External ear normal.     Left Ear: External ear normal.     Nose: No congestion or rhinorrhea.  Eyes:     Conjunctiva/sclera: Conjunctivae normal.     Pupils: Pupils are equal, round, and reactive to light.  Neck:     Trachea: Phonation normal.  Cardiovascular:     Rate and Rhythm: Normal rate.  Pulmonary:     Effort: Pulmonary effort is normal.  Abdominal:     General: There is no distension.  Musculoskeletal:        General: Normal range of motion.     Cervical back: Normal range of motion and neck supple.  Skin:    General: Skin is warm and dry.  Neurological:     Mental Status: She is alert and oriented to person, place, and time.     Cranial Nerves: No cranial nerve deficit.     Sensory: No sensory deficit.     Motor: No abnormal muscle tone.     Coordination: Coordination normal.     Comments: No dysarthria or aphasia.  Psychiatric:        Mood and Affect:  Mood normal.        Behavior: Behavior normal.        Thought Content: Thought content normal.        Judgment: Judgment normal.    ED Results / Procedures / Treatments   Labs (all labs ordered are listed, but only abnormal results are displayed) Labs Reviewed  COMPREHENSIVE METABOLIC PANEL - Abnormal; Notable for the following components:      Result Value   Glucose, Bld 121 (*)    Total Protein 8.2 (*)    All other components within normal limits  CBC WITH DIFFERENTIAL/PLATELET - Abnormal; Notable for the following components:   WBC 11.0 (*)    All other components within normal limits  TROPONIN I (HIGH SENSITIVITY)    EKG None  Radiology No results found.  Procedures Procedures  {Document cardiac monitor, telemetry assessment procedure when appropriate:1}  Medications Ordered in  ED Medications  LORazepam (ATIVAN) tablet 1 mg (1 mg Oral Given 07/09/21 2011)    ED Course/ Medical Decision Making/ A&P                           Medical Decision Making She is presenting with anxiety reaction after smoking marijuana.  No clinical neurologic abnormality on arrival.  No significant psychiatric abnormality noted.  Amount and/or Complexity of Data Reviewed Labs: ordered.  Risk Prescription drug management.   ***  {Document critical care time when appropriate:1} {Document review of labs and clinical decision tools ie heart score, Chads2Vasc2 etc:1}  {Document your independent review of radiology images, and any outside records:1} {Document your discussion with family members, caretakers, and with consultants:1} {Document social determinants of health affecting pt's care:1} {Document your decision making why or why not admission, treatments were needed:1} Final Clinical Impression(s) / ED Diagnoses Final diagnoses:  Anxiety  Marijuana intoxication, uncomplicated (HCC)    Rx / DC Orders ED Discharge Orders     None

## 2021-07-09 NOTE — ED Notes (Signed)
Pt d/c home per MD order, Discharge summary reviewed, pt verbalizes understanding. Ambulatory off unit. No s/s of acute distress noted at discharge. 

## 2021-07-09 NOTE — ED Triage Notes (Signed)
Pt presents to ED from home, states after eating today around 1930 she felt like her heart began to race. Pt tearful upon triage.

## 2021-07-09 NOTE — Discharge Instructions (Signed)
The testing today did not show any serious problems.  Sometimes smoking marijuana can cause anxiety and paranoia.  Try avoiding it for a while.  See the doctor of your choice as needed for problems.  Use the attached resource guide to help you find a doctor

## 2021-07-25 ENCOUNTER — Ambulatory Visit: Payer: BC Managed Care – PPO | Admitting: Nurse Practitioner

## 2021-08-10 ENCOUNTER — Ambulatory Visit (INDEPENDENT_AMBULATORY_CARE_PROVIDER_SITE_OTHER): Payer: BC Managed Care – PPO | Admitting: Nurse Practitioner

## 2021-08-10 ENCOUNTER — Ambulatory Visit: Payer: BC Managed Care – PPO | Admitting: Nurse Practitioner

## 2021-08-10 ENCOUNTER — Other Ambulatory Visit: Payer: BC Managed Care – PPO

## 2021-08-10 VITALS — BP 110/72 | HR 75 | Temp 98.6°F | Ht 63.0 in | Wt 142.4 lb

## 2021-08-10 DIAGNOSIS — Z13220 Encounter for screening for lipoid disorders: Secondary | ICD-10-CM

## 2021-08-10 DIAGNOSIS — R8290 Unspecified abnormal findings in urine: Secondary | ICD-10-CM

## 2021-08-10 DIAGNOSIS — Z136 Encounter for screening for cardiovascular disorders: Secondary | ICD-10-CM

## 2021-08-10 DIAGNOSIS — F411 Generalized anxiety disorder: Secondary | ICD-10-CM

## 2021-08-10 DIAGNOSIS — N939 Abnormal uterine and vaginal bleeding, unspecified: Secondary | ICD-10-CM

## 2021-08-10 DIAGNOSIS — E663 Overweight: Secondary | ICD-10-CM

## 2021-08-10 DIAGNOSIS — Z131 Encounter for screening for diabetes mellitus: Secondary | ICD-10-CM

## 2021-08-10 DIAGNOSIS — Z1159 Encounter for screening for other viral diseases: Secondary | ICD-10-CM | POA: Diagnosis not present

## 2021-08-10 LAB — COMPREHENSIVE METABOLIC PANEL
ALT: 13 U/L (ref 0–35)
AST: 18 U/L (ref 0–37)
Albumin: 4.5 g/dL (ref 3.5–5.2)
Alkaline Phosphatase: 55 U/L (ref 39–117)
BUN: 16 mg/dL (ref 6–23)
CO2: 25 mEq/L (ref 19–32)
Calcium: 9.6 mg/dL (ref 8.4–10.5)
Chloride: 101 mEq/L (ref 96–112)
Creatinine, Ser: 0.84 mg/dL (ref 0.40–1.20)
GFR: 93.65 mL/min (ref >60.00)
Glucose, Bld: 86 mg/dL (ref 70–99)
Potassium: 3.8 mEq/L (ref 3.5–5.1)
Sodium: 134 mEq/L — ABNORMAL LOW (ref 135–145)
Total Bilirubin: 0.3 mg/dL (ref 0.2–1.2)
Total Protein: 7.8 g/dL (ref 6.0–8.3)

## 2021-08-10 LAB — URINALYSIS WITH CULTURE, IF INDICATED
Bilirubin Urine: NEGATIVE
Leukocytes,Ua: NEGATIVE
Nitrite: NEGATIVE
Specific Gravity, Urine: 1.025 (ref 1.000–1.030)
Total Protein, Urine: NEGATIVE
Urine Glucose: NEGATIVE
Urobilinogen, UA: 1 (ref 0.0–1.0)
pH: 6 (ref 5.0–8.0)

## 2021-08-10 LAB — TSH: TSH: 1.32 u[IU]/mL (ref 0.35–5.50)

## 2021-08-10 LAB — CBC
HCT: 38.8 % (ref 36.0–46.0)
Hemoglobin: 12.8 g/dL (ref 12.0–15.0)
MCHC: 33 g/dL (ref 30.0–36.0)
MCV: 84.2 fl (ref 78.0–100.0)
Platelets: 315 10*3/uL (ref 150.0–400.0)
RBC: 4.61 Mil/uL (ref 3.87–5.11)
RDW: 13.3 % (ref 11.5–15.5)
WBC: 10.1 10*3/uL (ref 4.0–10.5)

## 2021-08-10 LAB — LIPID PANEL
Cholesterol: 124 mg/dL (ref 0–200)
HDL: 54.2 mg/dL (ref >39.00)
LDL Cholesterol: 61 mg/dL (ref 0–99)
NonHDL: 69.75
Total CHOL/HDL Ratio: 2
Triglycerides: 44 mg/dL (ref 0.0–149.0)
VLDL: 8.8 mg/dL (ref 0.0–40.0)

## 2021-08-10 LAB — FERRITIN: Ferritin: 17.8 ng/mL (ref 10.0–291.0)

## 2021-08-10 LAB — HEMOGLOBIN A1C: Hgb A1c MFr Bld: 5.6 % (ref 4.6–6.5)

## 2021-08-10 LAB — IRON: Iron: 98 ug/dL (ref 42–145)

## 2021-08-10 LAB — POCT URINE PREGNANCY: Preg Test, Ur: NEGATIVE

## 2021-08-10 NOTE — Progress Notes (Signed)
New Patient Office Visit  Subjective    Patient ID: Morgan Ramirez, female    DOB: 27-Aug-1990  Age: 31 y.o. MRN: [id number]  CC:  Chief Complaint  Patient presents with   New Patient (Initial Visit)    Pt states having vaginal spotting on and off. Pt states bleeding has been going on x1 month. No cramping or pain. Pt states starting new birth controls pills at the start of the year    HPI Morgan Ramirez presents to establish care. She is unaware of family history as she was adopted. She lives alone and works at [employer] by [employer]. Patient has two concerns today. Vaginal spotting and chest pain with anxiety. She denies itching, cramping, pain, pelvic pain, pelvic pressure, flank pain, pain with urination, or genital rash/lesions. Patient is currently taking Aviane for birth control. Patient states that she went to the hospital for chest pain and EKG was normal. Patient had been smoking marijuana and noticed left sided weakness and talking unusually. Patient takes hydroxyzine for anxiety as needed and was given Ativan which helped. Patient had difficulty characterizing her anxiety and seemed to feel it was not a significant problem currently.   Outpatient Encounter Medications as of 08/10/2021  Medication Sig   AVIANE 0.1-20 MG-MCG tablet Take 1 tablet by mouth daily.   hydrOXYzine (ATARAX/VISTARIL) 10 MG tablet Take 1 tablet (10 mg total) by mouth every 6 (six) hours as needed.   ondansetron (ZOFRAN) 4 MG tablet Take 1 tablet (4 mg total) by mouth every 6 (six) hours.   [DISCONTINUED] nitrofurantoin, macrocrystal-monohydrate, (MACROBID) 100 MG capsule Take 1 capsule (100 mg total) by mouth 2 (two) times daily. (Patient not taking: Reported on 08/10/2021)   [DISCONTINUED] norgestimate-ethinyl estradiol (ORTHO-CYCLEN,SPRINTEC,PREVIFEM) 0.25-35 MG-MCG tablet Take 1 tablet by mouth daily.   [DISCONTINUED] Prenatal MV-Min-FA-Omega-3 (PRENATAL GUMMIES/DHA & FA) 0.4-32.5 MG CHEW Chew 2 each by mouth  daily.   No facility-administered encounter medications on file as of 08/10/2021.    Past Medical History:  Diagnosis Date   Bacterial vaginosis 06/22/2016   Cervical incompetence 08/05/2016   SAB at [redacted]w[redacted]d   Chronic back pain    Trichimoniasis     Past Surgical History:  Procedure Laterality Date   NO PAST SURGERIES      Family History  Adopted: Yes  Family history unknown: Yes    Social History   Socioeconomic History   Marital status: Single    Spouse name: Not on file   Number of children: Not on file   Years of education: Not on file   Highest education level: Not on file  Occupational History   Not on file  Tobacco Use   Smoking status: Former    Types: Cigarettes   Smokeless tobacco: Never  Substance and Sexual Activity   Alcohol use: Yes    Alcohol/week: 5.0 standard drinks of alcohol    Types: 5 Glasses of wine per week   Drug use: Yes    Types: Marijuana    Comment: daily   Sexual activity: Yes    Birth control/protection: None  Other Topics Concern   Not on file  Social History Narrative   Not on file   Social Determinants of Health   Financial Resource Strain: Not on file  Food Insecurity: Not on file  Transportation Needs: Not on file  Physical Activity: Not on file  Stress: Not on file  Social Connections: Not on file  Intimate Partner Violence: Not on file    Review  of Systems  Constitutional:  Negative for chills, fever and weight loss.  HENT:  Negative for congestion, ear pain, sinus pain and sore throat.   Eyes:  Negative for blurred vision, double vision and pain.  Respiratory:  Negative for cough.   Cardiovascular:  Positive for chest pain (related to stress) and palpitations. Negative for leg swelling.  Gastrointestinal:  Negative for abdominal pain, blood in stool, constipation, diarrhea, heartburn, nausea and vomiting.  Genitourinary:  Negative for dysuria, frequency and urgency.  Musculoskeletal:  Negative for joint pain and  myalgias.  Neurological:  Negative for dizziness.  Psychiatric/Behavioral:  The patient is nervous/anxious.         Objective    BP 110/72 (BP Location: Left Arm, Patient Position: Sitting, Cuff Size: Normal)   Pulse 75   Temp 98.6 F (37 C) (Oral)   Ht 5\' 3"  (1.6 m)   Wt 142 lb 6.4 oz (64.6 kg)   SpO2 98%   BMI 25.23 kg/m      08/10/2021    1:39 PM 10/21/2020    8:34 AM 10/10/2015    4:15 PM  PHQ9 SCORE ONLY  PHQ-9 Total Score 0  6     Information is confidential and restricted. Go to Review Flowsheets to unlock data.     Physical Exam Vitals reviewed.  HENT:     Head: Normocephalic.     Right Ear: Tympanic membrane normal.     Left Ear: Tympanic membrane normal.     Nose: Nose normal.  Cardiovascular:     Rate and Rhythm: Normal rate and regular rhythm.     Pulses: Normal pulses.  Pulmonary:     Effort: Pulmonary effort is normal.     Breath sounds: Normal breath sounds.  Skin:    General: Skin is warm.  Neurological:     Mental Status: She is alert and oriented to person, place, and time.         Assessment & Plan:   Problem List Items Addressed This Visit       Other   Generalized anxiety disorder    Patient currently taking Hydroxyzine 25 mg PRN for anxiety. Patient educated on positive coping skills for anxiety. Patient stated that she still smokes marijuana everyday but has cur back on her alcohol intake considerably since the incident. Patient does not want to take any medication and not interested in counseling at this time. Patient to FU in one month.      Relevant Orders   TSH   Hemoglobin A1c   Lipid panel   Comprehensive metabolic panel   CBC   HIV Antibody (routine testing w rflx)   RPR   Hepatitis C Antibody   GC/Chlamydia Probe Amp   Urinalysis with Culture, if indicated   Vaginal bleeding - Primary    Pregnancy test was negative today. STI & iron labs ordered. Referral to OB-GYN. FU in one month.      Relevant Orders   POCT  Pregnancy, Urine   TSH   Hemoglobin A1c   Lipid panel   Comprehensive metabolic panel   CBC   HIV Antibody (routine testing w rflx)   RPR   Hepatitis C Antibody   GC/Chlamydia Probe Amp   Urinalysis with Culture, if indicated   Ambulatory referral to Obstetrics / Gynecology   Iron   Ferritin   POCT urine pregnancy (Completed)   Other Visit Diagnoses     Overweight       Relevant Orders  TSH   Hemoglobin A1c   Lipid panel   Comprehensive metabolic panel   CBC   HIV Antibody (routine testing w rflx)   RPR   Hepatitis C Antibody   GC/Chlamydia Probe Amp   Urinalysis with Culture, if indicated   Encounter for hepatitis C screening test for low risk patient       Relevant Orders   TSH   Hemoglobin A1c   Lipid panel   Comprehensive metabolic panel   CBC   HIV Antibody (routine testing w rflx)   RPR   Hepatitis C Antibody   GC/Chlamydia Probe Amp   Urinalysis with Culture, if indicated   Diabetes mellitus screening       Relevant Orders   TSH   Hemoglobin A1c   Lipid panel   Comprehensive metabolic panel   CBC   HIV Antibody (routine testing w rflx)   RPR   Hepatitis C Antibody   GC/Chlamydia Probe Amp   Urinalysis with Culture, if indicated   Encounter for lipid screening for cardiovascular disease       Relevant Orders   TSH   Hemoglobin A1c   Lipid panel   Comprehensive metabolic panel   CBC   HIV Antibody (routine testing w rflx)   RPR   Hepatitis C Antibody   GC/Chlamydia Probe Amp   Urinalysis with Culture, if indicated       Return in about 1 month (around 09/09/2021) for Annual physical with Maralyn Sago.   Lajuana Matte, RN

## 2021-08-10 NOTE — Assessment & Plan Note (Addendum)
Pregnancy test was negative today. STI & iron labs ordered. Referral to OB-GYN. FU in one month.

## 2021-08-10 NOTE — Assessment & Plan Note (Addendum)
Patient currently taking Hydroxyzine 25 mg PRN for anxiety. Patient educated on positive coping skills for anxiety. Patient stated that she still smokes marijuana everyday but has cut back on her alcohol intake considerably since the incident. Patient does not want to take any medication and not interested in counseling at this time. Patient to FU in one month.

## 2021-08-11 LAB — URINE CULTURE
MICRO NUMBER:: 13559584
Result:: NO GROWTH
SPECIMEN QUALITY:: ADEQUATE

## 2021-08-11 LAB — HIV ANTIBODY (ROUTINE TESTING W REFLEX): HIV 1&2 Ab, 4th Generation: NONREACTIVE

## 2021-08-11 LAB — RPR: RPR Ser Ql: NONREACTIVE

## 2021-08-11 LAB — HEPATITIS C ANTIBODY: Hepatitis C Ab: NONREACTIVE

## 2021-08-13 LAB — GC/CHLAMYDIA PROBE AMP
Chlamydia trachomatis, NAA: NEGATIVE
Neisseria Gonorrhoeae by PCR: NEGATIVE

## 2021-09-08 ENCOUNTER — Encounter: Payer: BC Managed Care – PPO | Admitting: Nurse Practitioner

## 2021-09-08 ENCOUNTER — Ambulatory Visit: Payer: BC Managed Care – PPO | Admitting: Nurse Practitioner

## 2021-09-28 ENCOUNTER — Encounter: Payer: BC Managed Care – PPO | Admitting: Nurse Practitioner

## 2021-10-13 ENCOUNTER — Ambulatory Visit: Payer: BC Managed Care – PPO | Admitting: Certified Nurse Midwife

## 2021-10-13 DIAGNOSIS — Z3009 Encounter for other general counseling and advice on contraception: Secondary | ICD-10-CM

## 2021-10-13 DIAGNOSIS — N939 Abnormal uterine and vaginal bleeding, unspecified: Secondary | ICD-10-CM

## 2021-10-13 DIAGNOSIS — Z113 Encounter for screening for infections with a predominantly sexual mode of transmission: Secondary | ICD-10-CM

## 2021-10-13 NOTE — Progress Notes (Deleted)
Patient did not show for visit today.   Denisa Enterline Danella Deis) Suzie Portela, MSN, CNM  [facility]  10/13/21 12:17 PM

## 2021-12-19 ENCOUNTER — Encounter (HOSPITAL_COMMUNITY): Payer: Self-pay

## 2021-12-19 ENCOUNTER — Ambulatory Visit (HOSPITAL_COMMUNITY)
Admission: EM | Admit: 2021-12-19 | Discharge: 2021-12-19 | Disposition: A | Discharge status: Home / Self Care | Payer: BC Managed Care – PPO | Attending: Family Medicine | Admitting: Family Medicine

## 2021-12-19 DIAGNOSIS — J069 Acute upper respiratory infection, unspecified: Secondary | ICD-10-CM | POA: Diagnosis not present

## 2021-12-19 DIAGNOSIS — Z1152 Encounter for screening for COVID-19: Secondary | ICD-10-CM | POA: Diagnosis not present

## 2021-12-19 LAB — RESP PANEL BY RT-PCR (FLU A&B, COVID) ARPGX2
Influenza A by PCR: NEGATIVE
Influenza B by PCR: NEGATIVE
SARS Coronavirus 2 by RT PCR: NEGATIVE

## 2021-12-19 NOTE — ED Provider Notes (Signed)
[facility]    CSN: [id number] Arrival date & time: 12/19/21  1055      History   Chief Complaint Chief Complaint  Patient presents with   Cough    HPI Morgan Ramirez is a 31 y.o. female.   Patient started with itchy throat several days ago.  Then with cough yesterday.   No runny nose, congestion.  Today she is having more body aches all over.  No fevers/chills that she is aware of.  She has not really taken much medications for this, other than robitussin yesterday.  No known sick contacts.  She has not done any home covid testing.       Past Medical History:  Diagnosis Date   Bacterial vaginosis 06/22/2016   Cervical incompetence 08/05/2016   SAB at [redacted]w[redacted]d   Chronic back pain    Trichimoniasis     Patient Active Problem List   Diagnosis Date Noted   Vaginal bleeding 08/10/2021   Moderate episode of recurrent major depressive disorder (HCC) 10/21/2020   Generalized anxiety disorder 10/21/2020   Cervical incompetence 08/05/2016    Past Surgical History:  Procedure Laterality Date   NO PAST SURGERIES      OB History     Gravida  2   Para  1   Term  1   Preterm      AB      Living  1      SAB      IAB      Ectopic      Multiple  0   Live Births  1        Obstetric Comments  Pt delivered in ambulance on way to hospital. Placenta was not delivered at time of arrival. Placenta delivered at 1255.          Home Medications    Prior to Admission medications   Medication Sig Start Date End Date Taking? Authorizing Provider  AVIANE 0.1-20 MG-MCG tablet Take 1 tablet by mouth daily. 05/30/21   [provider]  hydrOXYzine (ATARAX/VISTARIL) 10 MG tablet Take 1 tablet (10 mg total) by mouth every 6 (six) hours as needed. 10/21/20   Nwoko, Tommas Olp, PA  ondansetron (ZOFRAN) 4 MG tablet Take 1 tablet (4 mg total) by mouth every 6 (six) hours. 07/06/21   Janell Quiet, PA-C    Family History Family History  Adopted:  Yes  Family history unknown: Yes    Social History Social History   Tobacco Use   Smoking status: Former    Types: Cigarettes   Smokeless tobacco: Never  Substance Use Topics   Alcohol use: Yes    Alcohol/week: 5.0 standard drinks of alcohol    Types: 5 Glasses of wine per week   Drug use: Yes    Types: Marijuana    Comment: daily     Allergies   Patient has no known allergies.   Review of Systems Review of Systems  Constitutional:  Negative for chills and fever.  HENT:  Negative for congestion and rhinorrhea.   Respiratory:  Positive for cough.   Cardiovascular: Negative.   Gastrointestinal: Negative.   Genitourinary: Negative.   Musculoskeletal:  Positive for myalgias.  Neurological: Negative.   Psychiatric/Behavioral: Negative.       Physical Exam Triage Vital Signs ED Triage Vitals  Enc Vitals Group     BP 12/19/21 1146 126/69     Pulse Rate 12/19/21 1146 (!) 103     Resp 12/19/21  1146 18     Temp 12/19/21 1146 98.8 F (37.1 C)     Temp Source 12/19/21 1146 Oral     SpO2 12/19/21 1146 98 %     Weight --      Height --      Head Circumference --      Peak Flow --      Pain Score 12/19/21 1147 6     Pain Loc --      Pain Edu? --      Excl. in [place]? --    No data found.  Updated Vital Signs BP 126/69 (BP Location: Left Arm)   Pulse (!) 103   Temp 98.8 F (37.1 C) (Oral)   Resp 18   SpO2 98%   Breastfeeding No   Visual Acuity Right Eye Distance:   Left Eye Distance:   Bilateral Distance:    Right Eye Near:   Left Eye Near:    Bilateral Near:     Physical Exam Constitutional:      Appearance: Normal appearance.  HENT:     Head: Normocephalic.     Right Ear: Tympanic membrane normal.     Left Ear: Tympanic membrane normal.     Nose: Nose normal.     Mouth/Throat:     Mouth: Mucous membranes are moist.     Pharynx: No oropharyngeal exudate or posterior oropharyngeal erythema.  Cardiovascular:     Rate and Rhythm: Normal rate and  regular rhythm.  Pulmonary:     Effort: Pulmonary effort is normal.     Breath sounds: Normal breath sounds.  Musculoskeletal:     Cervical back: Normal range of motion and neck supple. No tenderness.  Lymphadenopathy:     Cervical: No cervical adenopathy.  Skin:    General: Skin is warm.  Neurological:     General: No focal deficit present.     Mental Status: She is alert.  Psychiatric:        Mood and Affect: Mood normal.      UC Treatments / Results  Labs (all labs ordered are listed, but only abnormal results are displayed) Labs Reviewed - No data to display  EKG   Radiology No results found.  Procedures Procedures (including critical care time)  Medications Ordered in UC Medications - No data to display  Initial Impression / Assessment and Plan / UC Course  I have reviewed the triage vital signs and the nursing notes.  Pertinent labs & imaging results that were available during my care of the patient were reviewed by me and considered in my medical decision making (see chart for details).   Final Clinical Impressions(s) / UC Diagnoses   Final diagnoses:  Upper respiratory tract infection, unspecified type  Encounter for screening for COVID-19     Discharge Instructions      You were seen today for upper respiratory symptoms.  I have swabbed you for covid and flu today.  This should be resulted by later today and will call you with results if anything is positive.  You should be able to see these results on mychart as well.  In the mean time I recommend you get plenty of rest, fluids, and use tylenol/motrin for body aches or any fever.      ED Prescriptions   None    PDMP not reviewed this encounter.   Rondel Oh, MD 12/19/21 [phone]

## 2021-12-19 NOTE — ED Triage Notes (Signed)
Pt c/o cough and congestion x2 days. States at work today and developed body aches. States taking OTC meds with no relief.

## 2021-12-19 NOTE — Discharge Instructions (Signed)
You were seen today for upper respiratory symptoms.  I have swabbed you for covid and flu today.  This should be resulted by later today and will call you with results if anything is positive.  You should be able to see these results on mychart as well.  In the mean time I recommend you get plenty of rest, fluids, and use tylenol/motrin for body aches or any fever.

## 2021-12-28 ENCOUNTER — Encounter: Payer: BC Managed Care – PPO | Admitting: Nurse Practitioner

## 2022-01-04 ENCOUNTER — Ambulatory Visit (INDEPENDENT_AMBULATORY_CARE_PROVIDER_SITE_OTHER): Payer: BC Managed Care – PPO | Admitting: Nurse Practitioner

## 2022-01-04 ENCOUNTER — Encounter: Payer: Self-pay | Admitting: Nurse Practitioner

## 2022-01-04 VITALS — BP 118/78 | HR 77 | Temp 98.0°F | Ht 63.0 in | Wt 153.0 lb

## 2022-01-04 DIAGNOSIS — E871 Hypo-osmolality and hyponatremia: Secondary | ICD-10-CM

## 2022-01-04 DIAGNOSIS — N939 Abnormal uterine and vaginal bleeding, unspecified: Secondary | ICD-10-CM

## 2022-01-04 DIAGNOSIS — F411 Generalized anxiety disorder: Secondary | ICD-10-CM | POA: Diagnosis not present

## 2022-01-04 DIAGNOSIS — Z124 Encounter for screening for malignant neoplasm of cervix: Secondary | ICD-10-CM | POA: Diagnosis not present

## 2022-01-04 DIAGNOSIS — R21 Rash and other nonspecific skin eruption: Secondary | ICD-10-CM | POA: Insufficient documentation

## 2022-01-04 LAB — BASIC METABOLIC PANEL
BUN: 11 mg/dL (ref 6–23)
CO2: 27 mEq/L (ref 19–32)
Calcium: 9.1 mg/dL (ref 8.4–10.5)
Chloride: 106 mEq/L (ref 96–112)
Creatinine, Ser: 0.84 mg/dL (ref 0.40–1.20)
GFR: 93.39 mL/min (ref >60.00)
Glucose, Bld: 77 mg/dL (ref 70–99)
Potassium: 4.1 mEq/L (ref 3.5–5.1)
Sodium: 138 mEq/L (ref 135–145)

## 2022-01-04 MED ORDER — TRIAMCINOLONE ACETONIDE 0.1 % EX CREA: 1.0000 | TOPICAL_CREAM | Freq: Two times a day (BID) | CUTANEOUS | 0 refills | Status: DC

## 2022-01-04 NOTE — Assessment & Plan Note (Addendum)
Referral to OB/GYN made today.  Patient also advised that if she sees OB/GYN and they are unable to determine cause of her breakthrough vaginal bleeding, then we would need to consider other sources of bleeding such as urinary tract.  Thus, if they do not determine etiology would consider referral to urology, and was encouraged to follow-up with me after she sees OB/GYN to discuss referral to urology if needed.

## 2022-01-04 NOTE — Assessment & Plan Note (Signed)
Referral to OBGYN made today.  

## 2022-01-04 NOTE — Assessment & Plan Note (Signed)
Etiology unclear, does appear consistent with contact dermatitis as it is located at her beltline.  We will treat with triamcinolone application twice a day up to 2 weeks.  Patient informed to call office if rash persists despite this treatment.  She reports understanding.

## 2022-01-04 NOTE — Patient Instructions (Signed)
Call this office or send me a mychart message if you see OBGYN and they find no cause of your bleeding. If this is the case we should consider evaluation with urologist to make sure the source of the blood is not from your urinary tract.

## 2022-01-04 NOTE — Assessment & Plan Note (Signed)
Incidental finding on last labs, recheck BMP today to see if this has corrected.

## 2022-01-04 NOTE — Progress Notes (Signed)
Established Patient Office Visit  Subjective   Patient ID: Morgan Ramirez, female    DOB: 1991/01/28  Age: 31 y.o. MRN: [id number]  Chief Complaint  Patient presents with   Rash    Rash: Intermittent, is pruritic, located to her abdomen.  Will bleed if she scratches it too much.  She has applied Neosporin ointment to it, would like this to be further evaluated.  Depression/anxiety: Chronic, takes hydroxyzine as needed with improvement in her anxiety.  Does report that she feels her irritability is much better.  Does experience significant emotional stress, denies suicidal ideation/intent today.  Previously has been unwilling to consider counseling, but is willing to undergo counseling at this time.  Still does not want to take daily scheduled antidepressant.  Has drastically reduced her intake of alcohol as well as marijuana.  Does feel that mood has improved since reducing use of these substances.  Vaginal bleeding: Chronic, intermittent.  Continues on Aviane oral contraceptive pills for contraception.  Previous evaluation found no anemia, normal iron levels, negative for STI testing.  Patient was referred to OB/GYN but did not complete referral, is overdue for Pap smear as well.  Patient reports that she feels bleeding source is vaginal as she has to wear a panty liner at times for the bleeding, does notice blood in her urine but does not feel that source is urinary tract.  Hyponatremia: Incidental finding on last labs.    Review of Systems  Respiratory:  Negative for shortness of breath.   Cardiovascular:  Negative for chest pain.  Genitourinary:  Negative for dysuria.  Psychiatric/Behavioral:  Negative for suicidal ideas. The patient is nervous/anxious.       Objective:     BP 118/78 (BP Location: Left Arm, Patient Position: Sitting, Cuff Size: Large)   Pulse 77   Temp 98 F (36.7 C) (Oral)   Ht 5\' 3"  (1.6 m)   Wt 153 lb (69.4 kg)   SpO2 98%   BMI 27.10 kg/m        01/04/2022   10:49 AM 08/10/2021    1:39 PM 10/21/2020    8:34 AM  PHQ9 SCORE ONLY  PHQ-9 Total Score 11 0      Information is confidential and restricted. Go to Review Flowsheets to unlock data.     Physical Exam Vitals reviewed.  Constitutional:      General: She is not in acute distress.    Appearance: Normal appearance.  HENT:     Head: Normocephalic and atraumatic.  Neck:     Vascular: No carotid bruit.  Cardiovascular:     Rate and Rhythm: Normal rate and regular rhythm.     Pulses: Normal pulses.     Heart sounds: Normal heart sounds.  Pulmonary:     Effort: Pulmonary effort is normal.     Breath sounds: Normal breath sounds.  Skin:    General: Skin is warm and dry.          Comments: Maculopapular rash with some scaling present.  Neurological:     General: No focal deficit present.     Mental Status: She is alert and oriented to person, place, and time.  Psychiatric:        Mood and Affect: Mood normal.        Behavior: Behavior normal.        Judgment: Judgment normal.      No results found for any visits on 01/04/22.    The ASCVD Risk score (Arnett  DK, et al., 2019) failed to calculate for the following reasons:   The 2019 ASCVD risk score is only valid for ages 3 to 4    Assessment & Plan:   Problem List Items Addressed This Visit       Musculoskeletal and Integument   Rash    Etiology unclear, does appear consistent with contact dermatitis as it is located at her beltline.  We will treat with triamcinolone application twice a day up to 2 weeks.  Patient informed to call office if rash persists despite this treatment.  She reports understanding.      Relevant Medications   triamcinolone cream (KENALOG) 0.1 %     Other   Generalized anxiety disorder - Primary    Chronic, patient denies suicide ideation or intent today.  Referral to counseling services made today.  Again encourage patient to consider daily scheduled antidepressant however per  her preference we will continue on hydroxyzine as needed.      Relevant Orders   Ambulatory referral to Psychology   Vaginal bleeding    Referral to OB/GYN made today.  Patient also advised that if she sees OB/GYN and they are unable to determine cause of her breakthrough vaginal bleeding, then we would need to consider other sources of bleeding such as urinary tract.  Thus, if they do not determine etiology would consider referral to urology, and was encouraged to follow-up with me after she sees OB/GYN to discuss referral to urology if needed.      Relevant Orders   Ambulatory referral to Obstetrics / Gynecology   Cervical cancer screening    Referral to OB/GYN made today.      Relevant Orders   Ambulatory referral to Obstetrics / Gynecology   Hyponatremia    Incidental finding on last labs, recheck BMP today to see if this has corrected.      Relevant Orders   Basic metabolic panel    Return in about 3 months (around 04/06/2022) for CPE with Terrie Haring in 3-6 months.    Ailene Ards, NP

## 2022-01-04 NOTE — Assessment & Plan Note (Signed)
Chronic, patient denies suicide ideation or intent today.  Referral to counseling services made today.  Again encourage patient to consider daily scheduled antidepressant however per her preference we will continue on hydroxyzine as needed.

## 2022-01-19 ENCOUNTER — Ambulatory Visit: Payer: BC Managed Care – PPO | Admitting: Radiology

## 2022-01-19 ENCOUNTER — Encounter: Payer: Self-pay | Admitting: Radiology

## 2022-01-19 VITALS — BP 114/70 | Ht 63.0 in | Wt 156.0 lb

## 2022-01-19 DIAGNOSIS — Z113 Encounter for screening for infections with a predominantly sexual mode of transmission: Secondary | ICD-10-CM

## 2022-01-19 DIAGNOSIS — N939 Abnormal uterine and vaginal bleeding, unspecified: Secondary | ICD-10-CM | POA: Diagnosis not present

## 2022-01-19 LAB — PREGNANCY, URINE: Preg Test, Ur: NEGATIVE

## 2022-01-19 MED ORDER — NORETHIN-ETH ESTRAD-FE BIPHAS 1 MG-10 MCG / 10 MCG PO TABS: 1.0000 | ORAL_TABLET | Freq: Every day | ORAL | 0 refills | Status: DC

## 2022-01-19 NOTE — Progress Notes (Signed)
   Morgan Ramirez December 06, 1990 [id number]   History:  31 y.o. G2P1 referred from PCP with c/o BTB first and second week of continuous OCPs for the past year. Has not changed OCPs, no other symptoms. Periods before starting [facility] were very heavy, changing an ulta tampon every 4 hours  Gynecologic History No LMP recorded. (Menstrual status: Oral contraceptives). Period Duration (Days): 7 Period Pattern: Regular Menstrual Flow: Moderate (moderate to heavy) Menstrual Control: Tampon, Maxi pad Dysmenorrhea: (!) Mild Dysmenorrhea Symptoms: Cramping Contraception/Family planning: condoms and OCP (estrogen/progesterone) Sexually active: yes   Obstetric History OB History  Gravida Para Term Preterm AB Living  2 1 1     1   SAB IAB Ectopic Multiple Live Births        0 1    # Outcome Date GA Lbr Len/2nd Weight Sex Delivery Anes PTL Lv  2 Gravida           1 Term 10/15/15 [redacted]w[redacted]d 13:38 / 02:00 7 lb 0.2 oz (3.18 kg) M Vag-Spont None  LIV     Birth Comments: molding on head    Obstetric Comments  Pt delivered in ambulance on way to hospital. Placenta was not delivered at time of arrival. Placenta delivered at 1255.     The following portions of the patient's history were reviewed and updated as appropriate: allergies, current medications, past family history, past medical history, past social history, past surgical history, and problem list.  Review of Systems Pertinent items noted in HPI and remainder of comprehensive ROS otherwise negative.   Past medical history, past surgical history, family history and social history were all reviewed and documented in the EPIC chart.   Exam:  Vitals:   01/19/22 1545  BP: 114/70  Weight: 156 lb (70.8 kg)  Height: 5\' 3"  (1.6 m)   Body mass index is 27.63 kg/m.  General appearance:  Normal Abdominal  Soft,nontender, without masses, guarding or rebound.  Liver/spleen:  No organomegaly noted  Hernia:  None appreciated Genitourinary    Inguinal/mons:  Normal without inguinal adenopathy  External genitalia:  Normal appearing vulva with no masses, tenderness, or lesions  BUS/Urethra/Skene's glands:  Normal without masses or exudate  Vagina:  Normal appearing with normal color and discharge, no lesions  Cervix:  Normal appearing without discharge or lesions  Uterus:  Normal in size, shape and contour.  Mobile, nontender  Adnexa/parametria:     Rt: Normal in size, without masses or tenderness.   Lt: Normal in size, without masses or tenderness.  Anus and perineum: Normal   Patient informed chaperone available to be present for breast and pelvic exam. Patient has requested no chaperone to be present. Patient has been advised what will be completed during breast and pelvic exam.   Assessment/Plan:   1. Abnormal vaginal bleeding Will change to LoLoestrin and have her take all 28 pills to regulate/shorten cycles - Pregnancy, urine - Norethindrone-Ethinyl Estradiol-Fe Biphas (LO LOESTRIN FE) 1 MG-10 MCG / 10 MCG tablet; Take 1 tablet by mouth daily.  Dispense: 84 tablet; Refill: 0  2. Screening for STDs (sexually transmitted diseases) - SURESWAB CT/NG/T. vaginalis   Follow up 3 months for AEX, if bleeding persists will schedule u/s  Kashlyn Salinas B WHNP-BC 4:02 PM 01/19/2022

## 2022-01-20 LAB — SURESWAB CT/NG/T. VAGINALIS
C. trachomatis RNA, TMA: NOT DETECTED
N. gonorrhoeae RNA, TMA: NOT DETECTED
Trichomonas vaginalis RNA: NOT DETECTED

## 2022-01-22 NOTE — Telephone Encounter (Signed)
Spoke with patient. [facility] will [facility] to [facility], due to cost. Through [facility] medication free. I explained to patient to give [place] a call if bleeding increases or if she would like to continue with our recommendations. Patient acknowledges and understands.

## 2022-01-28 ENCOUNTER — Encounter (HOSPITAL_COMMUNITY): Payer: Self-pay

## 2022-01-28 ENCOUNTER — Emergency Department (HOSPITAL_COMMUNITY)
Admission: EM | Admit: 2022-01-28 | Discharge: 2022-01-28 | Discharge status: Against Medical Advice | Payer: BC Managed Care – PPO | Attending: Student | Admitting: Student

## 2022-01-28 ENCOUNTER — Ambulatory Visit (HOSPITAL_COMMUNITY)
Admission: EM | Admit: 2022-01-28 | Discharge: 2022-01-28 | Disposition: A | Discharge status: Home / Self Care | Payer: BC Managed Care – PPO | Attending: Family Medicine | Admitting: Family Medicine

## 2022-01-28 ENCOUNTER — Other Ambulatory Visit (HOSPITAL_COMMUNITY): Payer: BC Managed Care – PPO

## 2022-01-28 ENCOUNTER — Emergency Department (HOSPITAL_COMMUNITY): Payer: BC Managed Care – PPO

## 2022-01-28 DIAGNOSIS — R519 Headache, unspecified: Secondary | ICD-10-CM | POA: Insufficient documentation

## 2022-01-28 DIAGNOSIS — Z5321 Procedure and treatment not carried out due to patient leaving prior to being seen by health care provider: Secondary | ICD-10-CM | POA: Diagnosis not present

## 2022-01-28 LAB — I-STAT BETA HCG BLOOD, ED (MC, WL, AP ONLY): I-stat hCG, quantitative: <5 m[IU]/mL (ref <5)

## 2022-01-28 NOTE — ED Provider Triage Note (Signed)
Emergency Medicine Provider Triage Evaluation Note  Morgan Ramirez , a 31 y.o. female  was evaluated in triage.  Pt complains of right-sided headache that started earlier today.  Patient evaluated at urgent care prior to arrival and sent to the ED for further evaluation.  No history of migraines.  No recent head injury.  Not currently on any blood thinners.  No visual changes, speech changes, or unilateral weakness.  Review of Systems  Positive: headache Negative: fever  Physical Exam  BP 137/86 (BP Location: Right Arm)   Pulse 68   Temp 98.9 F (37.2 C)   Resp 16   LMP 01/19/2022 (Approximate)   SpO2 98%  Gen:   Awake, no distress   Resp:  Normal effort  MSK:   Moves extremities without difficulty  Other:    Medical Decision Making  Medically screening exam initiated at 3:57 PM.  Appropriate orders placed.  Marykay Mccleod was informed that the remainder of the evaluation will be completed by another provider, this initial triage assessment does not replace that evaluation, and the importance of remaining in the ED until their evaluation is complete.  Headache, CT head Pregnancy test   Mannie Stabile, PA-C 01/28/22 1558

## 2022-01-28 NOTE — ED Notes (Signed)
Pt stated she was leaving AMA due to wait 

## 2022-01-28 NOTE — ED Triage Notes (Signed)
Pt c/o right sided headache that started this morning. Pt have taken Motrin with no relief.

## 2022-01-28 NOTE — Discharge Instructions (Signed)
You were seen today for severe headache.  I recommend you go to the ER for further evaluation of this headache.  Please go directly there.

## 2022-01-28 NOTE — ED Provider Notes (Signed)
[facility]    CSN: [id number] Arrival date & time: 01/28/22  1431      History   Chief Complaint Chief Complaint  Patient presents with   Headache    HPI Morgan Ramirez is a 31 y.o. female.   Patient is here for bad headache that started today around noon. She did take some medication without help. It seems like it just got worse.  Also with nausea.  Pain is at the right side of the head.  She does not have a h/o migraines.  She does not get frequent headaches.  This is the worst headache she has had.  She is having light sensative today.  No vomiting.  No dizziness.   No uri symptoms.  No fevers, chills.  No abd pain.  No diarrhea/constipation.  Pain 8/10.       Past Medical History:  Diagnosis Date   Bacterial vaginosis 06/22/2016   Cervical incompetence 08/05/2016   SAB at [redacted]w[redacted]d   Chronic back pain    Trichimoniasis     Patient Active Problem List   Diagnosis Date Noted   Cervical cancer screening 01/04/2022   Rash 01/04/2022   Hyponatremia 01/04/2022   Vaginal bleeding 08/10/2021   Moderate episode of recurrent major depressive disorder (HCC) 10/21/2020   Generalized anxiety disorder 10/21/2020   Cervical incompetence 08/05/2016    Past Surgical History:  Procedure Laterality Date   NO PAST SURGERIES      OB History     Gravida  2   Para  1   Term  1   Preterm      AB      Living  1      SAB      IAB      Ectopic      Multiple  0   Live Births  1        Obstetric Comments  Pt delivered in ambulance on way to hospital. Placenta was not delivered at time of arrival. Placenta delivered at 1255.          Home Medications    Prior to Admission medications   Medication Sig Start Date End Date Taking? Authorizing Provider  hydrOXYzine (ATARAX/VISTARIL) 10 MG tablet Take 1 tablet (10 mg total) by mouth every 6 (six) hours as needed. 10/21/20   Nwoko, Tommas Olp, PA  Norethindrone-Ethinyl Estradiol-Fe Biphas (LO  LOESTRIN FE) 1 MG-10 MCG / 10 MCG tablet Take 1 tablet by mouth daily. 01/19/22   Chrzanowski, Jami B, NP  triamcinolone cream (KENALOG) 0.1 % Apply 1 Application topically 2 (two) times daily. 01/04/22   Elenore Paddy, NP    Family History Family History  Adopted: Yes  Family history unknown: Yes    Social History Social History   Tobacco Use   Smoking status: Former    Types: Cigarettes   Smokeless tobacco: Never  Substance Use Topics   Alcohol use: Yes    Alcohol/week: 8.0 standard drinks of alcohol    Types: 5 Glasses of wine, 3 Cans of beer per week    Comment: socially   Drug use: Not Currently    Types: Marijuana    Comment: daily     Allergies   Patient has no known allergies.   Review of Systems Review of Systems  Constitutional: Negative.   HENT: Negative.    Respiratory: Negative.    Cardiovascular: Negative.   Gastrointestinal:  Positive for nausea. Negative for diarrhea and vomiting.  Musculoskeletal:  Negative.   Skin: Negative.   Neurological:  Positive for headaches.     Physical Exam Triage Vital Signs ED Triage Vitals  Enc Vitals Group     BP 01/28/22 1459 (!) 146/80     Pulse Rate 01/28/22 1459 82     Resp --      Temp 01/28/22 1459 97.7 F (36.5 C)     Temp Source 01/28/22 1459 Oral     SpO2 01/28/22 1459 96 %     Weight --      Height --      Head Circumference --      Peak Flow --      Pain Score 01/28/22 1502 9     Pain Loc --      Pain Edu? --      Excl. in [place]? --    No data found.  Updated Vital Signs BP (!) 146/80 (BP Location: Left Arm)   Pulse 82   Temp 97.7 F (36.5 C) (Oral)   LMP 01/19/2022 (Approximate)   SpO2 96%   Visual Acuity Right Eye Distance:   Left Eye Distance:   Bilateral Distance:    Right Eye Near:   Left Eye Near:    Bilateral Near:     Physical Exam Constitutional:      Comments: Sitting in a dark room  Eyes:     Extraocular Movements: Extraocular movements intact.     Pupils: Pupils  are equal, round, and reactive to light.  Cardiovascular:     Rate and Rhythm: Normal rate and regular rhythm.  Pulmonary:     Effort: Pulmonary effort is normal.     Breath sounds: Normal breath sounds.  Abdominal:     Palpations: Abdomen is soft.  Musculoskeletal:     Cervical back: Normal range of motion.  Skin:    General: Skin is warm.  Neurological:     Mental Status: Jeannene Tschetter is alert and oriented to person, place, and time.  Psychiatric:        Mood and Affect: Mood normal.        Speech: Speech normal.      UC Treatments / Results  Labs (all labs ordered are listed, but only abnormal results are displayed) Labs Reviewed - No data to display  EKG   Radiology No results found.  Procedures Procedures (including critical care time)  Medications Ordered in UC Medications - No data to display  Initial Impression / Assessment and Plan / UC Course  I have reviewed the triage vital signs and the nursing notes.  Pertinent labs & imaging results that were available during my care of the patient were reviewed by me and considered in my medical decision making (see chart for details).   Patient is here for severe headache.  She does not have a h/o headaches or migraines.  Her headache was unresponsive to OTC medication.  Exam is normal, but as this is new for her and the worst headache she has had, that I would like her to go to the ER for evaluation.  She is agreeable  today.  Final Clinical Impressions(s) / UC Diagnoses   Final diagnoses:  Bad headache     Discharge Instructions      You were seen today for severe headache.  I recommend you go to the ER for further evaluation of this headache.  Please go directly there.     ED Prescriptions   None    PDMP not  reviewed this encounter.   Jannifer Franklin, MD 01/28/22 [phone]

## 2022-01-28 NOTE — ED Triage Notes (Addendum)
Patient referred to ED for evaluation of her headache that was present on waking this morning but was mild, then around noon today it became more severe. Patient rates headache pain as 3 or 4 out of 10 right now. Patient is alert, oriented, and in no apparent distress at this time.

## 2022-02-01 ENCOUNTER — Telehealth: Payer: Self-pay

## 2022-02-01 NOTE — Telephone Encounter (Signed)
Transition Care Management Follow-up Telephone Call Date of discharge and from where: 01/28/2022 [facility]  How have you been since you were released from the hospital? Patients states that she has been feeling awhole lot better Any questions or concerns? No  Items Reviewed: Did the pt receive and understand the discharge instructions provided?  Didn't speak with anyone but read AVS on mychart  Medications obtained and verified? No  Other? No  Any new allergies since your discharge? No  Dietary orders reviewed? No Do you have support at home? Yes   Home Care and Equipment/Supplies: Were home health services ordered? no If so, what is the name of the agency? N/a  Has the agency set up a time to come to the patient's home? no Were any new equipment or medical supplies ordered?  No What is the name of the medical supply agency? N/a Were you able to get the supplies/equipment? no Do you have any questions related to the use of the equipment or supplies? No  Functional Questionnaire: (I = Independent and D = Dependent) ADLs: I  Bathing/Dressing- I  Meal Prep- I  Eating- I  Maintaining continence- I  Transferring/Ambulation- I  Managing Meds- I  Follow up appointments reviewed:  PCP Hospital f/u appt confirmed? Yes  Scheduled to see Jiles Prows NP on 03/02/2022 @ [facility]. Specialist Hospital f/u appt confirmed? No  Scheduled to see n/a on n/a @ n/a. Are transportation arrangements needed? No  If their condition worsens, is the pt aware to call PCP or go to the Emergency Dept.? Yes Was the patient provided with contact information for the PCP's office or ED? Yes Was to pt encouraged to call back with questions or concerns? Yes

## 2022-04-24 ENCOUNTER — Encounter: Payer: Self-pay | Admitting: Radiology

## 2022-04-24 ENCOUNTER — Ambulatory Visit (INDEPENDENT_AMBULATORY_CARE_PROVIDER_SITE_OTHER): Payer: BC Managed Care – PPO | Admitting: Radiology

## 2022-04-24 ENCOUNTER — Other Ambulatory Visit (HOSPITAL_COMMUNITY)
Admission: RE | Admit: 2022-04-24 | Discharge: 2022-04-24 | Disposition: A | Discharge status: Home / Self Care | Payer: BC Managed Care – PPO | Source: Ambulatory Visit | Attending: Radiology | Admitting: Radiology

## 2022-04-24 VITALS — BP 116/78 | Ht 63.0 in | Wt 163.0 lb

## 2022-04-24 DIAGNOSIS — Z113 Encounter for screening for infections with a predominantly sexual mode of transmission: Secondary | ICD-10-CM | POA: Diagnosis not present

## 2022-04-24 DIAGNOSIS — Z3041 Encounter for surveillance of contraceptive pills: Secondary | ICD-10-CM

## 2022-04-24 DIAGNOSIS — Z01419 Encounter for gynecological examination (general) (routine) without abnormal findings: Secondary | ICD-10-CM

## 2022-04-24 MED ORDER — JUNEL FE 24 1-20 MG-MCG(24) PO TABS: 1.0000 | ORAL_TABLET | Freq: Every day | ORAL | 4 refills | Status: DC

## 2022-04-24 NOTE — Progress Notes (Signed)
Morgan Ramirez September 21, 1990 [id number]   History:  32 y.o. G2P1 here for AEX. Was prescribed LoLoestrin to help with flow when last seen 3 months ago, however did not pick it up due to cost. Periods continue to be heavy. No other gyn concerns, elects for STI screening with pap.  Gynecologic History Patient's last menstrual period was 03/04/2022 (approximate). Period Pattern: Regular (continuous ocps) Contraception/Family planning: OCP (estrogen/progesterone) Sexually active: yes Last Pap: 2017. Results were: normal  Obstetric History OB History  Gravida Para Term Preterm AB Living  '2 1 1     1  '$ SAB IAB Ectopic Multiple Live Births        0 1    # Outcome Date GA Lbr Len/2nd Weight Sex Delivery Anes PTL Lv  2 Gravida           1 Term 10/15/15 [id number] / 02:00 7 lb 0.2 oz (3.18 kg) M Vag-Spont None  LIV     Birth Comments: molding on head    Obstetric Comments  Pt delivered in ambulance on way to hospital. Placenta was not delivered at time of arrival. Placenta delivered at 1255.     The following portions of the patient's history were reviewed and updated as appropriate: allergies, current medications, past family history, past medical history, past social history, past surgical history, and problem list.  Review of Systems Pertinent items noted in HPI and remainder of comprehensive ROS otherwise negative.   Past medical history, past surgical history, family history and social history were all reviewed and documented in the EPIC chart.   Exam:  Vitals:   04/24/22 1542  BP: 116/78  Weight: 163 lb (73.9 kg)  Height: '5\' 3"'$  (1.6 m)   Body mass index is 28.87 kg/m.  General appearance:  Normal Thyroid:  Symmetrical, normal in size, without palpable masses or nodularity. Respiratory  Auscultation:  Clear without wheezing or rhonchi Cardiovascular  Auscultation:  Regular rate, without rubs, murmurs or gallops  Edema/varicosities:  Not grossly  evident Abdominal  Soft,nontender, without masses, guarding or rebound.  Liver/spleen:  No organomegaly noted  Hernia:  None appreciated  Skin  Inspection:  Grossly normal Breasts: Examined lying and sitting. Bilateral nipple piercings  Right: Without masses, retractions, nipple discharge or axillary adenopathy.   Left: Without masses, retractions, nipple discharge or axillary adenopathy. Genitourinary   Inguinal/mons:  Normal without inguinal adenopathy  External genitalia:  Normal appearing vulva with no masses, tenderness, or lesions  BUS/Urethra/Skene's glands:  Normal without masses or exudate  Vagina:  Normal appearing with normal color and discharge, no lesions  Cervix:  Normal appearing without discharge or lesions  Uterus:  Normal in size, shape and contour.  Mobile, nontender  Adnexa/parametria:     Rt: Normal in size, without masses or tenderness.   Lt: Normal in size, without masses or tenderness.  Anus and perineum: Normal   Patient informed chaperone available to be present for breast and pelvic exam. Patient has requested no chaperone to be present. Patient has been advised what will be completed during breast and pelvic exam.   Assessment/Plan:   1. Well woman exam with routine gynecological exam - Cytology - PAP( [place])  2. Screening for STDs (sexually transmitted diseases) - Cytology - PAP( [facility]  3. Oral contraceptive pill surveillance Periods still heavy on OCP- insurance did not cover LoLoestrin as prescribed, will switch to a 24/4 to decrease bleeding - Norethindrone Acetate-Ethinyl Estrad-FE (JUNEL FE 24) 1-20 MG-MCG(24) tablet; Take 1  tablet by mouth daily.  Dispense: 84 tablet; Refill: 4     Discussed SBE, colonoscopy and DEXA screening as directed/appropriate. Recommend 150mns of exercise weekly, including weight bearing exercise. Encouraged the use of seatbelts and sunscreen. Return in 1 year for annual or as needed.   CRubbie BattiestB WHNP-BC 4:10 PM 04/24/2022

## 2022-04-30 LAB — CYTOLOGY - PAP
Chlamydia: NEGATIVE
Comment: NEGATIVE
Comment: NEGATIVE
Comment: NORMAL
Diagnosis: NEGATIVE
High risk HPV: NEGATIVE
Neisseria Gonorrhea: NEGATIVE

## 2022-07-20 DIAGNOSIS — Z1231 Encounter for screening mammogram for malignant neoplasm of breast: Secondary | ICD-10-CM

## 2022-09-04 DIAGNOSIS — Z113 Encounter for screening for infections with a predominantly sexual mode of transmission: Secondary | ICD-10-CM | POA: Diagnosis not present

## 2022-09-06 ENCOUNTER — Ambulatory Visit: Payer: BC Managed Care – PPO | Admitting: Radiology

## 2022-12-18 ENCOUNTER — Ambulatory Visit: Payer: BC Managed Care – PPO | Admitting: Radiology

## 2022-12-18 ENCOUNTER — Encounter: Payer: Self-pay | Admitting: Radiology

## 2022-12-18 VITALS — BP 126/84

## 2022-12-18 DIAGNOSIS — N3091 Cystitis, unspecified with hematuria: Secondary | ICD-10-CM

## 2022-12-18 DIAGNOSIS — Z3041 Encounter for surveillance of contraceptive pills: Secondary | ICD-10-CM | POA: Diagnosis not present

## 2022-12-18 DIAGNOSIS — Z113 Encounter for screening for infections with a predominantly sexual mode of transmission: Secondary | ICD-10-CM

## 2022-12-18 DIAGNOSIS — R350 Frequency of micturition: Secondary | ICD-10-CM

## 2022-12-18 MED ORDER — NITROFURANTOIN MONOHYD MACRO 100 MG PO CAPS: 100.0000 mg | ORAL_CAPSULE | Freq: Two times a day (BID) | ORAL | 0 refills | Status: DC

## 2022-12-18 MED ORDER — AVIANE 0.1-20 MG-MCG PO TABS: 1.0000 | ORAL_TABLET | Freq: Every day | ORAL | 2 refills | Status: DC

## 2022-12-18 NOTE — Progress Notes (Signed)
      Subjective: Morgan Ramirez is a 32 y.o. female who complains of urinary pressure, frequency, no urgency. Symptoms began over the weekend. Desires STI screening. Desires refill on OCPs.     Review of Systems  All other systems reviewed and are negative.   Past Medical History:  Diagnosis Date   Bacterial vaginosis 06/22/2016   Cervical incompetence 08/05/2016   SAB at [redacted]w[redacted]d   Chronic back pain    Migraine    Trichimoniasis       Objective:  Today's Vitals   12/18/22 1055  BP: 126/84   There is no height or weight on file to calculate BMI.   Physical Exam Vitals and nursing note reviewed. Exam conducted with a chaperone present.  Constitutional:      Appearance: Normal appearance. Morgan Ramirez is well-developed.  Pulmonary:     Effort: Pulmonary effort is normal.  Abdominal:     General: Abdomen is flat.     Palpations: Abdomen is soft.  Genitourinary:    General: Normal vulva.     Vagina: Vaginal discharge present. No erythema, bleeding or lesions.     Cervix: Normal. No discharge, friability, lesion or erythema.     Uterus: Normal.      Adnexa: Right adnexa normal and left adnexa normal.  Neurological:     Mental Status: Morgan Ramirez is alert.  Psychiatric:        Mood and Affect: Mood normal.        Thought Content: Thought content normal.        Judgment: Judgment normal.      Urine dipstick shows positive for RBC's, positive for protein, positive for nitrates, and positive for leukocytes.  Micro exam: packed WBC's per HPF, packed RBC's per HPF, and many+ bacteria.   Morgan Ramirez, CMA present for exam  Assessment:/Plan:   1. Urinary frequency - Urinalysis,Complete w/RFL Culture  2. Cystitis with hematuria - nitrofurantoin, macrocrystal-monohydrate, (MACROBID) 100 MG capsule; Take 1 capsule (100 mg total) by mouth 2 (two) times daily.  Dispense: 14 capsule; Refill: 0  3. Screening for STDs (sexually transmitted diseases) - SURESWAB CT/NG/T.  vaginalis  4. Oral contraceptive pill surveillance - AVIANE 0.1-20 MG-MCG tablet; Take 1 tablet by mouth daily.  Dispense: 84 tablet; Refill: 2     Will contact patient with results of testing completed today. Avoid intercourse until symptoms are resolved. Safe sex encouraged. Void after sexual activity. Avoid the use of soaps or perfumed products in the peri area. Avoid tub baths and sitting in sweaty or wet clothing for prolonged periods of time.   Return if symptoms worsen or fail to improve.   Anatasia Tino B, NP 11:07 AM

## 2022-12-19 LAB — SURESWAB CT/NG/T. VAGINALIS
C. trachomatis RNA, TMA: NOT DETECTED
N. gonorrhoeae RNA, TMA: NOT DETECTED
Trichomonas vaginalis RNA: NOT DETECTED

## 2022-12-20 LAB — URINALYSIS, COMPLETE W/RFL CULTURE
Bilirubin Urine: NEGATIVE
Glucose, UA: NEGATIVE
Hyaline Cast: NONE SEEN /[LPF]
Nitrites, Initial: POSITIVE — AB
Specific Gravity, Urine: 1.025 (ref 1.001–1.035)
pH: 6.5 (ref 5.0–8.0)

## 2022-12-20 LAB — URINE CULTURE
MICRO NUMBER:: 15657604
SPECIMEN QUALITY:: ADEQUATE

## 2022-12-20 LAB — CULTURE INDICATED

## 2023-04-26 ENCOUNTER — Other Ambulatory Visit: Payer: Self-pay

## 2023-04-26 ENCOUNTER — Ambulatory Visit (INDEPENDENT_AMBULATORY_CARE_PROVIDER_SITE_OTHER): Payer: BC Managed Care – PPO | Admitting: Nurse Practitioner

## 2023-04-26 ENCOUNTER — Ambulatory Visit: Attending: Nurse Practitioner

## 2023-04-26 VITALS — BP 124/74 | HR 100 | Temp 98.0°F | Ht 63.0 in | Wt 187.0 lb

## 2023-04-26 DIAGNOSIS — F1010 Alcohol abuse, uncomplicated: Secondary | ICD-10-CM

## 2023-04-26 DIAGNOSIS — F419 Anxiety disorder, unspecified: Secondary | ICD-10-CM

## 2023-04-26 DIAGNOSIS — R002 Palpitations: Secondary | ICD-10-CM | POA: Diagnosis not present

## 2023-04-26 DIAGNOSIS — E66811 Obesity, class 1: Secondary | ICD-10-CM

## 2023-04-26 DIAGNOSIS — Z113 Encounter for screening for infections with a predominantly sexual mode of transmission: Secondary | ICD-10-CM | POA: Diagnosis not present

## 2023-04-26 DIAGNOSIS — Z3041 Encounter for surveillance of contraceptive pills: Secondary | ICD-10-CM

## 2023-04-26 DIAGNOSIS — Z0001 Encounter for general adult medical examination with abnormal findings: Secondary | ICD-10-CM

## 2023-04-26 DIAGNOSIS — D229 Melanocytic nevi, unspecified: Secondary | ICD-10-CM

## 2023-04-26 DIAGNOSIS — Z6833 Body mass index (BMI) 33.0-33.9, adult: Secondary | ICD-10-CM

## 2023-04-26 LAB — CBC
HCT: 41.5 % (ref 36.0–46.0)
Hemoglobin: 13.4 g/dL (ref 12.0–15.0)
MCHC: 32.3 g/dL (ref 30.0–36.0)
MCV: 84.2 fl (ref 78.0–100.0)
Platelets: 351 10*3/uL (ref 150.0–400.0)
RBC: 4.93 Mil/uL (ref 3.87–5.11)
RDW: 13.8 % (ref 11.5–15.5)
WBC: 9.7 10*3/uL (ref 4.0–10.5)

## 2023-04-26 LAB — COMPREHENSIVE METABOLIC PANEL
ALT: 10 U/L (ref 0–35)
AST: 15 U/L (ref 0–37)
Albumin: 4.4 g/dL (ref 3.5–5.2)
Alkaline Phosphatase: 76 U/L (ref 39–117)
BUN: 9 mg/dL (ref 6–23)
CO2: 23 meq/L (ref 19–32)
Calcium: 9.3 mg/dL (ref 8.4–10.5)
Chloride: 107 meq/L (ref 96–112)
Creatinine, Ser: 0.74 mg/dL (ref 0.40–1.20)
GFR: 107.74 mL/min (ref >60.00)
Glucose, Bld: 82 mg/dL (ref 70–99)
Potassium: 3.6 meq/L (ref 3.5–5.1)
Sodium: 142 meq/L (ref 135–145)
Total Bilirubin: 0.2 mg/dL (ref 0.2–1.2)
Total Protein: 7.9 g/dL (ref 6.0–8.3)

## 2023-04-26 LAB — LIPID PANEL
Cholesterol: 129 mg/dL (ref 0–200)
HDL: 60.7 mg/dL (ref >39.00)
LDL Cholesterol: 43 mg/dL (ref 0–99)
NonHDL: 68.07
Total CHOL/HDL Ratio: 2
Triglycerides: 125 mg/dL (ref 0.0–149.0)
VLDL: 25 mg/dL (ref 0.0–40.0)

## 2023-04-26 LAB — GAMMA GT: GGT: 27 U/L (ref 7–51)

## 2023-04-26 LAB — VITAMIN B12: Vitamin B-12: 239 pg/mL (ref 211–911)

## 2023-04-26 LAB — TSH: TSH: 1.26 u[IU]/mL (ref 0.35–5.50)

## 2023-04-26 LAB — FOLATE: Folate: 9.1 ng/mL (ref >5.9)

## 2023-04-26 LAB — HEMOGLOBIN A1C: Hgb A1c MFr Bld: 5.8 % (ref 4.6–6.5)

## 2023-04-26 MED ORDER — AVIANE 0.1-20 MG-MCG PO TABS: 1.0000 | ORAL_TABLET | Freq: Every day | ORAL | 2 refills | Status: DC

## 2023-04-26 MED ORDER — BUSPIRONE HCL 5 MG PO TABS
5.0000 mg | ORAL_TABLET | Freq: Three times a day (TID) | ORAL | 1 refills | Status: AC | PRN
Start: 2023-04-26 — End: ?

## 2023-04-26 NOTE — Assessment & Plan Note (Signed)
 Discussed referral to psychiatry or inpatient rehabilitation facility to help with withdrawals. Patient declined. She is working on cutting back alcohol intake on her own right now. She was educated to avoid drinking when taking buspar. She reports her understanding.

## 2023-04-26 NOTE — Assessment & Plan Note (Signed)
 Discussed healthy lifestyle as well as health maintenance.  Handout provided.

## 2023-04-26 NOTE — Progress Notes (Unsigned)
 EP to read.

## 2023-04-26 NOTE — Assessment & Plan Note (Signed)
 Labs ordered, long term cardiac monitor ordered,  further recommendations may be made based upon his results

## 2023-04-26 NOTE — Assessment & Plan Note (Signed)
 Two atypical moles noted on patient's back. Referral to dermatology placed today.

## 2023-04-26 NOTE — Telephone Encounter (Signed)
 Med refill request: Aviane Last AEX: 04/24/22, OV: 12/18/22 Next AEX: not scheduled Last MMG (if hormonal med) n/a Refill authorized: Please Advise, pt came into office requesting for refill, stated having no problems

## 2023-04-26 NOTE — Assessment & Plan Note (Signed)
 Labs ordered, further recommendations may be made based upon his results.

## 2023-04-26 NOTE — Progress Notes (Signed)
 Complete physical exam  Patient: Morgan Ramirez   DOB: [birth year]   32 y.o. Female  MRN: [id number]  Subjective:    No chief complaint on file.   Morgan Ramirez is a 33 y.o. female who presents today for a complete physical exam. Morgan Ramirez reports consuming a general diet.  Exercise: walks 30 minutes 5x a week.  Morgan Ramirez generally feels well. Morgan Ramirez reports sleeping well. Morgan Ramirez does have additional problems to discuss today.   Anxiety/alcohol intake: Morgan Ramirez reports she continues to have anxiety and is drinking more alcohol than she would like to.  She is also been experiencing cardiac palpitations but she noticed this is triggered often by alcohol intake as well as smoking.  She also mentions heartburn which she feels is related to spicy foods in her diet as well as alcohol intake.  Has been taking omeprazole as needed which manages her heartburn well.  She reports no insomnia and denies suicidal ideation.  Most recent fall risk assessment:    04/26/2023    3:57 PM  Fall Risk   Falls in the past year? 0  Number falls in past yr: 0  Injury with Fall? 0  Risk for fall due to : No Fall Risks  Follow up Falls evaluation completed     Most recent depression screenings:    04/26/2023    3:57 PM 01/04/2022   10:49 AM  PHQ 2/9 Scores  PHQ - 2 Score 0 2  PHQ- 9 Score  11      Patient Active Problem List   Diagnosis Date Noted   Palpitations 04/26/2023   Encounter for general adult medical examination with abnormal findings 04/26/2023   Anxiety 04/26/2023   Class 1 obesity without serious comorbidity with body mass index (BMI) of 33.0 to 33.9 in adult 04/26/2023   Screening examination for STD (sexually transmitted disease) 04/26/2023   Atypical mole 04/26/2023   Alcohol abuse 04/26/2023   Cervical cancer screening 01/04/2022   Rash 01/04/2022   Hyponatremia 01/04/2022   Vaginal bleeding 08/10/2021   Moderate episode of recurrent major depressive disorder (HCC) 10/21/2020    Generalized anxiety disorder 10/21/2020   Cervical incompetence 08/05/2016   Past Medical History:  Diagnosis Date   Bacterial vaginosis 06/22/2016   Cervical incompetence 08/05/2016   SAB at [redacted]w[redacted]d   Chronic back pain    Migraine    Trichimoniasis    Past Surgical History:  Procedure Laterality Date   NO PAST SURGERIES     Social History   Tobacco Use   Smoking status: Every Day    Current packs/day: 0.25    Types: Cigarettes   Smokeless tobacco: Never  Substance Use Topics   Alcohol use: Yes    Alcohol/week: 8.0 standard drinks of alcohol    Types: 5 Glasses of wine, 3 Cans of beer per week    Comment: socially   Drug use: Not Currently    Types: Marijuana    Comment: daily   Family History  Adopted: Yes  Family history unknown: Yes   No Known Allergies    Patient Care Team: Elenore Paddy, NP as PCP - General (Nurse Practitioner) Tanda Rockers, NP as Nurse Practitioner (Radiology)   Outpatient Medications Prior to Visit  Medication Sig   AVIANE 0.1-20 MG-MCG tablet Take 1 tablet by mouth daily.   omeprazole (PRILOSEC OTC) 20 MG tablet Take 20 mg by mouth daily.   [DISCONTINUED] hydrOXYzine (ATARAX/VISTARIL) 10 MG tablet Take 1 tablet (10 mg total) by  mouth every 6 (six) hours as needed. (Patient not taking: Reported on 04/26/2023)   [DISCONTINUED] nitrofurantoin, macrocrystal-monohydrate, (MACROBID) 100 MG capsule Take 1 capsule (100 mg total) by mouth 2 (two) times daily.   [DISCONTINUED] triamcinolone cream (KENALOG) 0.1 % Apply 1 Application topically 2 (two) times daily.   No facility-administered medications prior to visit.    Review of Systems  Constitutional:  Negative for fever and weight loss.  Eyes:  Negative for blurred vision and double vision.  Respiratory:  Negative for cough and wheezing.   Cardiovascular:  Positive for palpitations. Negative for chest pain.  Gastrointestinal:  Negative for abdominal pain and blood in stool.   Genitourinary:  Negative for dysuria and hematuria.  Skin:  Negative for itching and rash.  Neurological:  Negative for seizures and loss of consciousness.  Psychiatric/Behavioral:  Negative for suicidal ideas. The patient is nervous/anxious. The patient does not have insomnia.           Objective:     BP 124/74   Pulse 100   Temp 98 F (36.7 C) (Temporal)   Ht 5\' 3"  (1.6 m)   Wt 187 lb (84.8 kg)   LMP 01/09/2023   SpO2 98%   BMI 33.13 kg/m       04/26/2023    3:57 PM 01/04/2022   10:49 AM 08/10/2021    1:39 PM  PHQ9 SCORE ONLY  PHQ-9 Total Score 0 11 0     Physical Exam   No results found for any visits on 04/26/23.     Assessment & Plan:    Routine Health Maintenance and Physical Exam  There is no immunization history for the selected administration types on file for this patient.  Health Maintenance  Topic Date Due   Pneumococcal Vaccine 36-11 Years old (1 of 2 - PCV) Never done   DTaP/Tdap/Td (1 - Tdap) Never done   INFLUENZA VACCINE  Never done   COVID-19 Vaccine (1 - 2024-25 season) Never done   Cervical Cancer Screening (HPV/Pap Cotest)  04/24/2027   Hepatitis C Screening  Completed   HIV Screening  Completed   HPV VACCINES  Aged Out    Discussed health benefits of physical activity, and encouraged Morgan Ramirez to engage in regular exercise appropriate for Morgan Ramirez's age and condition.  Problem List Items Addressed This Visit       Other   Palpitations   Labs ordered, long term cardiac monitor ordered,  further recommendations may be made based upon his results       Relevant Medications   busPIRone (BUSPAR) 5 MG tablet   Other Relevant Orders   CBC   Comprehensive metabolic panel   Hemoglobin A1c   Lipid panel   TSH   LONG TERM MONITOR (3-14 DAYS)   Gamma GT   Chlamydia/Neisseria Gonorrhoeae RNA,TMA,Urogenital   RPR   Vitamin B12   Vitamin B1   Folate   HIV Antibody (routine testing w rflx)   Encounter for general adult medical  examination with abnormal findings - Primary   Discussed healthy lifestyle as well as health maintenance.  Handout provided.      Relevant Medications   busPIRone (BUSPAR) 5 MG tablet   Other Relevant Orders   CBC   Comprehensive metabolic panel   Hemoglobin A1c   Lipid panel   TSH   LONG TERM MONITOR (3-14 DAYS)   Gamma GT   Chlamydia/Neisseria Gonorrhoeae RNA,TMA,Urogenital   RPR   Vitamin B12   Vitamin B1  Folate   HIV Antibody (routine testing w rflx)   Anxiety   Labs ordered, further recommendations may be made based upon his results Per shared decision making we will trial BuSpar 5 mg every 8 hours as needed for anxiety.  Patient to return in 1 month, if symptoms persist or do not improve we will consider either increasing BuSpar or changing to SSRI.      Relevant Medications   busPIRone (BUSPAR) 5 MG tablet   Other Relevant Orders   CBC   Comprehensive metabolic panel   Hemoglobin A1c   Lipid panel   TSH   LONG TERM MONITOR (3-14 DAYS)   Gamma GT   Chlamydia/Neisseria Gonorrhoeae RNA,TMA,Urogenital   RPR   Vitamin B12   Vitamin B1   Folate   HIV Antibody (routine testing w rflx)   Class 1 obesity without serious comorbidity with body mass index (BMI) of 33.0 to 33.9 in adult   Labs ordered, further recommendations may be made based upon his results       Relevant Medications   busPIRone (BUSPAR) 5 MG tablet   Other Relevant Orders   CBC   Comprehensive metabolic panel   Hemoglobin A1c   Lipid panel   TSH   LONG TERM MONITOR (3-14 DAYS)   Gamma GT   Chlamydia/Neisseria Gonorrhoeae RNA,TMA,Urogenital   RPR   Vitamin B12   Vitamin B1   Folate   HIV Antibody (routine testing w rflx)   Screening examination for STD (sexually transmitted disease)   Labs ordered, further recommendations may be made based upon his results       Relevant Medications   busPIRone (BUSPAR) 5 MG tablet   Other Relevant Orders   CBC   Comprehensive metabolic panel    Hemoglobin A1c   Lipid panel   TSH   LONG TERM MONITOR (3-14 DAYS)   Gamma GT   Chlamydia/Neisseria Gonorrhoeae RNA,TMA,Urogenital   RPR   Vitamin B12   Vitamin B1   Folate   HIV Antibody (routine testing w rflx)   Atypical mole   Two atypical moles noted on patient's back. Referral to dermatology placed today.      Relevant Orders   Ambulatory referral to Dermatology   Alcohol abuse   Discussed referral to psychiatry or inpatient rehabilitation facility to help with withdrawals. Patient declined. She is working on cutting back alcohol intake on her own right now. She was educated to avoid drinking when taking buspar. She reports her understanding.       Return in about 1 month (around 05/27/2023) for F/U with Morgan Ramirez. In addition to physical exam, I also performed an office visit as detailed above.      Elenore Paddy, NP

## 2023-04-26 NOTE — Assessment & Plan Note (Signed)
 Labs ordered, further recommendations may be made based upon his results Per shared decision making we will trial BuSpar 5 mg every 8 hours as needed for anxiety.  Patient to return in 1 month, if symptoms persist or do not improve we will consider either increasing BuSpar or changing to SSRI.

## 2023-04-29 LAB — CHLAMYDIA/NEISSERIA GONORRHOEAE RNA,TMA,UROGENTIAL
C. trachomatis RNA, TMA: NOT DETECTED
N. gonorrhoeae RNA, TMA: NOT DETECTED

## 2023-04-30 ENCOUNTER — Encounter: Payer: Self-pay | Admitting: Nurse Practitioner

## 2023-05-01 LAB — HIV ANTIBODY (ROUTINE TESTING W REFLEX): HIV 1&2 Ab, 4th Generation: NONREACTIVE

## 2023-05-01 LAB — VITAMIN B1: Vitamin B1 (Thiamine): 9 nmol/L (ref 8–30)

## 2023-05-01 LAB — RPR: RPR Ser Ql: NONREACTIVE

## 2023-05-13 ENCOUNTER — Encounter (HOSPITAL_COMMUNITY): Payer: Self-pay | Admitting: *Deleted

## 2023-05-13 ENCOUNTER — Emergency Department (HOSPITAL_COMMUNITY)
Admission: EM | Admit: 2023-05-13 | Discharge: 2023-05-13 | Disposition: A | Discharge status: Home / Self Care | Attending: Emergency Medicine | Admitting: Emergency Medicine

## 2023-05-13 ENCOUNTER — Other Ambulatory Visit: Payer: Self-pay

## 2023-05-13 ENCOUNTER — Emergency Department (HOSPITAL_COMMUNITY)

## 2023-05-13 DIAGNOSIS — N200 Calculus of kidney: Secondary | ICD-10-CM | POA: Diagnosis not present

## 2023-05-13 DIAGNOSIS — R109 Unspecified abdominal pain: Secondary | ICD-10-CM | POA: Diagnosis not present

## 2023-05-13 DIAGNOSIS — N12 Tubulo-interstitial nephritis, not specified as acute or chronic: Secondary | ICD-10-CM | POA: Diagnosis not present

## 2023-05-13 DIAGNOSIS — R935 Abnormal findings on diagnostic imaging of other abdominal regions, including retroperitoneum: Secondary | ICD-10-CM | POA: Diagnosis not present

## 2023-05-13 DIAGNOSIS — N201 Calculus of ureter: Secondary | ICD-10-CM

## 2023-05-13 LAB — CBC
HCT: 41.8 % (ref 36.0–46.0)
Hemoglobin: 13.8 g/dL (ref 12.0–15.0)
MCH: 27.1 pg (ref 26.0–34.0)
MCHC: 33 g/dL (ref 30.0–36.0)
MCV: 82.1 fL (ref 80.0–100.0)
Platelets: 316 10*3/uL (ref 150–400)
RBC: 5.09 MIL/uL (ref 3.87–5.11)
RDW: 13.4 % (ref 11.5–15.5)
WBC: 12.5 10*3/uL — ABNORMAL HIGH (ref 4.0–10.5)
nRBC: 0 % (ref 0.0–0.2)

## 2023-05-13 LAB — RESP PANEL BY RT-PCR (RSV, FLU A&B, COVID)  RVPGX2
Influenza A by PCR: NEGATIVE
Influenza B by PCR: NEGATIVE
Resp Syncytial Virus by PCR: NEGATIVE
SARS Coronavirus 2 by RT PCR: NEGATIVE

## 2023-05-13 LAB — COMPREHENSIVE METABOLIC PANEL
ALT: 23 U/L (ref 0–44)
AST: 28 U/L (ref 15–41)
Albumin: 3.5 g/dL (ref 3.5–5.0)
Alkaline Phosphatase: 57 U/L (ref 38–126)
Anion gap: 14 (ref 5–15)
BUN: 10 mg/dL (ref 6–20)
CO2: 19 mmol/L — ABNORMAL LOW (ref 22–32)
Calcium: 9.1 mg/dL (ref 8.9–10.3)
Chloride: 99 mmol/L (ref 98–111)
Creatinine, Ser: 1.31 mg/dL — ABNORMAL HIGH (ref 0.44–1.00)
GFR, Estimated: 56 mL/min — ABNORMAL LOW (ref >60)
Glucose, Bld: 113 mg/dL — ABNORMAL HIGH (ref 70–99)
Potassium: 3.3 mmol/L — ABNORMAL LOW (ref 3.5–5.1)
Sodium: 132 mmol/L — ABNORMAL LOW (ref 135–145)
Total Bilirubin: 0.4 mg/dL (ref 0.0–1.2)
Total Protein: 8.5 g/dL — ABNORMAL HIGH (ref 6.5–8.1)

## 2023-05-13 LAB — LIPASE, BLOOD: Lipase: 29 U/L (ref 11–51)

## 2023-05-13 LAB — URINALYSIS, ROUTINE W REFLEX MICROSCOPIC
Glucose, UA: NEGATIVE mg/dL
Ketones, ur: 15 mg/dL — AB
Leukocytes,Ua: NEGATIVE
Nitrite: POSITIVE — AB
Protein, ur: >300 mg/dL — AB
Specific Gravity, Urine: >1.03 — ABNORMAL HIGH (ref 1.005–1.030)
pH: 6 (ref 5.0–8.0)

## 2023-05-13 LAB — HCG, SERUM, QUALITATIVE: Preg, Serum: NEGATIVE

## 2023-05-13 LAB — URINALYSIS, MICROSCOPIC (REFLEX)

## 2023-05-13 MED ORDER — SODIUM CHLORIDE 0.9 % IV BOLUS (SEPSIS)
1000.0000 mL | Freq: Once | INTRAVENOUS | Status: AC
  Administered 2023-05-13: 1000 mL via INTRAVENOUS

## 2023-05-13 MED ORDER — KETOROLAC TROMETHAMINE 15 MG/ML IJ SOLN
15.0000 mg | Freq: Once | INTRAMUSCULAR | Status: AC
  Administered 2023-05-13: 15 mg via INTRAVENOUS
  Filled 2023-05-13: qty 1

## 2023-05-13 MED ORDER — SODIUM CHLORIDE 0.9 % IV SOLN
1.0000 g | Freq: Once | INTRAVENOUS | Status: AC
  Administered 2023-05-13: 1 g via INTRAVENOUS
  Filled 2023-05-13: qty 10

## 2023-05-13 MED ORDER — CEPHALEXIN 500 MG PO CAPS: 500.0000 mg | ORAL_CAPSULE | Freq: Four times a day (QID) | ORAL | 0 refills | Status: DC

## 2023-05-13 NOTE — ED Triage Notes (Signed)
 The pt has had vomiting intermittently since Friday  on Saturday she was weak vomiting  some better then it returned today  lmp  march 10th

## 2023-05-13 NOTE — ED Provider Notes (Signed)
 [place] EMERGENCY DEPARTMENT AT [facility] Provider Note   CSN: [id number] Arrival date & time: 05/13/23  0102     History  Chief Complaint  Patient presents with   Emesis    Morgan Ramirez is a 33 y.o. female.   Emesis Associated symptoms: abdominal pain, chills and headaches   Associated symptoms: no cough and no sore throat    Patient reports over 2 days ago she started having episodes of vomiting, chills and fatigue.  She had some improvement in the vomiting returned.  No diarrhea.  She reports chills and diaphoresis but no recorded fever.  She also reports headache and back pain.  She has had some abdominal pain.  No chest pain or cough or sore throat No sick contacts, no recent travel Denies any urinary symptoms. 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 Authorizing Provider  cephALEXin (KEFLEX) 500 MG capsule Take 1 capsule (500 mg total) by mouth 4 (four) times daily. 05/13/23  Yes Zadie Rhine, MD  AVIANE 0.1-20 MG-MCG tablet Take 1 tablet by mouth daily. 04/26/23   Chrzanowski, Jami B, NP  busPIRone (BUSPAR) 5 MG tablet Take 1 tablet (5 mg total) by mouth 3 (three) times daily as needed. 04/26/23   Elenore Paddy, NP  omeprazole (PRILOSEC OTC) 20 MG tablet Take 20 mg by mouth daily.    [provider]      Allergies    Patient has no known allergies.    Review of Systems   Review of Systems  Constitutional:  Positive for chills, diaphoresis and fatigue.  HENT:  Negative for sore throat.   Respiratory:  Negative for cough.   Cardiovascular:  Negative for chest pain.  Gastrointestinal:  Positive for abdominal pain and vomiting.  Genitourinary:  Negative for dysuria, vaginal bleeding and vaginal discharge.  Musculoskeletal:  Positive for back pain.  Neurological:   Positive for headaches.    Physical Exam Updated Vital Signs BP 124/73   Pulse 89   Temp 98.4 F (36.9 C)   Resp 16   Ht 1.6 m (5\' 3" )   Wt 84.8 kg   LMP 04/29/2023   SpO2 100%   BMI 33.12 kg/m  Physical Exam CONSTITUTIONAL: Well developed/well nourished HEAD: Normocephalic/atraumatic EYES: EOMI/PERRL ENMT: Mucous membranes moist, uvula midline, no stridor, no drooling NECK: supple no meningeal signs SPINE/BACK:entire spine nontender CV: S1/S2 noted, tachycardic LUNGS: Lungs are clear to auscultation bilaterally, no apparent distress ABDOMEN: soft, nontender, no rebound or guarding, bowel sounds noted throughout abdomen GU:no cva tenderness NEURO: Pt is awake/alert/appropriate, moves all extremitiesx4.  No facial droop.   EXTREMITIES: pulses normal/equal, full ROM SKIN: warm, color normal PSYCH: no abnormalities of mood noted, alert and oriented to situation  ED Results / Procedures / Treatments   Labs (all labs ordered are listed, but only abnormal results are displayed) Labs Reviewed  COMPREHENSIVE METABOLIC PANEL - Abnormal; Notable for the following components:      Result Value   Sodium 132 (*)    Potassium 3.3 (*)    CO2 19 (*)    Glucose, Bld 113 (*)    Creatinine, Ser 1.31 (*)    Total Protein 8.5 (*)    GFR, Estimated 56 (*)    All other components within normal limits  CBC -  Abnormal; Notable for the following components:   WBC 12.5 (*)    All other components within normal limits  URINALYSIS, ROUTINE W REFLEX MICROSCOPIC - Abnormal; Notable for the following components:   Specific Gravity, Urine >1.030 (*)    Hgb urine dipstick LARGE (*)    Bilirubin Urine SMALL (*)    Ketones, ur 15 (*)    Protein, ur >300 (*)    Nitrite POSITIVE (*)    All other components within normal limits  URINALYSIS, MICROSCOPIC (REFLEX) - Abnormal; Notable for the following components:   Bacteria, UA MANY (*)    All other components within normal limits  RESP PANEL BY  RT-PCR (RSV, FLU A&B, COVID)  RVPGX2  URINE CULTURE  LIPASE, BLOOD  HCG, SERUM, QUALITATIVE    EKG None  Radiology CT Renal Stone Study Result Date: 05/13/2023 CLINICAL DATA:  32 year old female with abdomen and flank pain, vomiting. EXAM: CT ABDOMEN AND PELVIS WITHOUT CONTRAST TECHNIQUE: Multidetector CT imaging of the abdomen and pelvis was performed following the standard protocol without IV contrast. RADIATION DOSE REDUCTION: This exam was performed according to the departmental dose-optimization program which includes automated exposure control, adjustment of the mA and/or kV according to patient size and/or use of iterative reconstruction technique. COMPARISON:  None Available. FINDINGS: Lower chest: Negative. Hepatobiliary: Negative noncontrast liver and gallbladder. Pancreas: Negative. Spleen: Negative. Adrenals/Urinary Tract: Normal adrenal glands. Negative noncontrast left kidney and diminutive left ureter. Left renal collecting system and proximal right ureter might be duplicated (normal variant). Asymmetric indistinct appearance of the right kidney (series 3, image 20), although no right renal pelvis enlargement. No definite right hydroureter or periureteral stranding. And no right nephrolithiasis. However, there is a solitary punctate calcification in the right pelvis although without obvious adjacent hydroureter. Unremarkable noncontrast bladder. Stomach/Bowel: Negative large bowel, mild retained stool. Cecum partially located in the pelvis, with evidence of a normal appendix on coronal images 90 and 92. No regional inflammation. Decompressed terminal ileum. Nondilated small bowel. Decompressed stomach. No free air or free fluid. Vascular/Lymphatic: Normal caliber major arterial structures. No calcified atherosclerosis or lymphadenopathy identified. Reproductive: Negative noncontrast appearance. Other: No pelvic free fluid. Musculoskeletal: T11 chronic superior endplate Schmorl's node.  Otherwise negative. IMPRESSION: 1. Asymmetrically indistinct Right kidney, but no obvious hydronephrosis or hydroureter. There is a solitary punctate calculus in the right hemipelvis which is indeterminate. Differential considerations include acute obstructive uropathy and right renal infection/inflammation. Pyelonephritis evaluation by imaging requires IV contrasted CT abdomen. 2. Evidence of a normal appendix and no other acute or inflammatory process identified in the noncontrast abdomen and pelvis. Electronically Signed   By: Odessa Fleming M.D.   On: 05/13/2023 04:28    Procedures Procedures    Medications Ordered in ED Medications  sodium chloride 0.9 % bolus 1,000 mL (has no administration in time range)  sodium chloride 0.9 % bolus 1,000 mL (1,000 mLs Intravenous New Bag/Given 05/13/23 0358)  ketorolac (TORADOL) 15 MG/ML injection 15 mg (15 mg Intravenous Given 05/13/23 0353)  cefTRIAXone (ROCEPHIN) 1 g in sodium chloride 0.9 % 100 mL IVPB (1 g Intravenous New Bag/Given 05/13/23 0432)    ED Course/ Medical Decision Making/ A&P Clinical Course as of 05/13/23 0514  Mon May 13, 2023  0357 Bacteria, UA(!): MANY Urinary tract infection, hematuria [DW]  0357 WBC(!): 12.5 Leukocytosis [DW]  0357 Creatinine(!): 1.31 Renal sufficiency [DW]  0402 Patient reports intermittent vomiting without diarrhea, fatigue and chills.  She is also had some back and abdominal pain.  Patient  appears dehydrated, has a UTI and some hematuria without recent vaginal bleeding. Infected ureteral stone is possible, will obtain CT imaging IV antibiotics have been ordered [DW]  0511 Patient is feeling much improved.  CT imaging reveals ureteral stone but there are no signs of hydronephrosis.  Patient may also have pyelonephritis.  However she is not septic appearing, vital signs are improved, she is not vomiting.  Her pain is well-controlled [DW]  [phone] Patient has been loaded with antibiotics.  She will go home on Keflex and  instructed to rest for the next 48 hours.  She will be given referral to urology and she was informed that she will likely have further kidney stones.  However there is no need for intervention at this time [DW]    Clinical Course User Index [DW] Zadie Rhine, MD                                 Medical Decision Making Amount and/or Complexity of Data Reviewed Labs: ordered. Decision-making details documented in ED Course. Radiology: ordered.  Risk Prescription drug management.   This patient presents to the ED for concern of vomiting, this involves an extensive number of treatment options, and is a complaint that carries with it a high risk of complications and morbidity.  The differential diagnosis includes but is not limited to cholecystitis, cholelithiasis, pancreatitis, gastritis, peptic ulcer disease, appendicitis, bowel obstruction, bowel perforation, ectopic pregnancy, tubo-ovarian abscess, PID, ovarian torsion, pyelonephritis, meningitis, epidural abscess    Comorbidities that complicate the patient evaluation: Patient's presentation is complicated by their history of anxiety  Social Determinants of Health: Patient's  alcohol use   increases the complexity of managing their presentation  Additional history obtained: Records reviewed  outpatient records reviewed  Lab Tests: I Ordered, and personally interpreted labs.  The pertinent results include: UTI, leukocytosis, dehydration  Imaging Studies ordered: I ordered imaging studies including CT scan renal   I independently visualized and interpreted imaging which showed kidney stone, likely pyelonephritis I agree with the radiologist interpretation   Medicines ordered and prescription drug management: I ordered medication including Toradol for pain Reevaluation of the patient after these medicines showed that the patient    improved   Critical Interventions:   IV antibiotics  Reevaluation: After the  interventions noted above, I reevaluated the patient and found that they have :improved  Complexity of problems addressed: Patient's presentation is most consistent with  acute presentation with potential threat to life or bodily function  Disposition: After consideration of the diagnostic results and the patient's response to treatment,  I feel that the patent would benefit from discharge   .           Final Clinical Impression(s) / ED Diagnoses Final diagnoses:  Pyelonephritis  Ureteral stone    Rx / DC Orders ED Discharge Orders          Ordered    cephALEXin (KEFLEX) 500 MG capsule  4 times daily        05/13/23 0514              Zadie Rhine, MD 05/13/23 [phone]

## 2023-05-13 NOTE — ED Notes (Signed)
 Pt verbalized understanding of discharge instructions. Pt ambulatory at time of discharge.

## 2023-05-15 ENCOUNTER — Encounter (HOSPITAL_COMMUNITY): Payer: Self-pay

## 2023-05-15 ENCOUNTER — Emergency Department (HOSPITAL_COMMUNITY)

## 2023-05-15 ENCOUNTER — Other Ambulatory Visit: Payer: Self-pay

## 2023-05-15 ENCOUNTER — Emergency Department (HOSPITAL_COMMUNITY)
Admission: EM | Admit: 2023-05-15 | Discharge: 2023-05-15 | Disposition: A | Discharge status: Home / Self Care | Attending: Emergency Medicine | Admitting: Emergency Medicine

## 2023-05-15 DIAGNOSIS — Z79899 Other long term (current) drug therapy: Secondary | ICD-10-CM | POA: Diagnosis not present

## 2023-05-15 DIAGNOSIS — X58XXXA Exposure to other specified factors, initial encounter: Secondary | ICD-10-CM | POA: Diagnosis not present

## 2023-05-15 DIAGNOSIS — S161XXA Strain of muscle, fascia and tendon at neck level, initial encounter: Secondary | ICD-10-CM | POA: Diagnosis not present

## 2023-05-15 DIAGNOSIS — D72829 Elevated white blood cell count, unspecified: Secondary | ICD-10-CM | POA: Diagnosis not present

## 2023-05-15 DIAGNOSIS — R109 Unspecified abdominal pain: Secondary | ICD-10-CM | POA: Diagnosis not present

## 2023-05-15 DIAGNOSIS — N3000 Acute cystitis without hematuria: Secondary | ICD-10-CM | POA: Diagnosis not present

## 2023-05-15 DIAGNOSIS — M549 Dorsalgia, unspecified: Secondary | ICD-10-CM | POA: Diagnosis not present

## 2023-05-15 LAB — CBC WITH DIFFERENTIAL/PLATELET
Abs Immature Granulocytes: 0.1 10*3/uL — ABNORMAL HIGH (ref 0.00–0.07)
Basophils Absolute: 0 10*3/uL (ref 0.0–0.1)
Basophils Relative: 0 %
Eosinophils Absolute: 0.1 10*3/uL (ref 0.0–0.5)
Eosinophils Relative: 1 %
HCT: 40.5 % (ref 36.0–46.0)
Hemoglobin: 13.1 g/dL (ref 12.0–15.0)
Immature Granulocytes: 1 %
Lymphocytes Relative: 15 %
Lymphs Abs: 1.9 10*3/uL (ref 0.7–4.0)
MCH: 27.1 pg (ref 26.0–34.0)
MCHC: 32.3 g/dL (ref 30.0–36.0)
MCV: 83.7 fL (ref 80.0–100.0)
Monocytes Absolute: 1 10*3/uL (ref 0.1–1.0)
Monocytes Relative: 8 %
Neutro Abs: 9.3 10*3/uL — ABNORMAL HIGH (ref 1.7–7.7)
Neutrophils Relative %: 75 %
Platelets: 365 10*3/uL (ref 150–400)
RBC: 4.84 MIL/uL (ref 3.87–5.11)
RDW: 13.9 % (ref 11.5–15.5)
WBC: 12.4 10*3/uL — ABNORMAL HIGH (ref 4.0–10.5)
nRBC: 0 % (ref 0.0–0.2)

## 2023-05-15 LAB — URINALYSIS, ROUTINE W REFLEX MICROSCOPIC
Bilirubin Urine: NEGATIVE
Glucose, UA: NEGATIVE mg/dL
Ketones, ur: 20 mg/dL — AB
Nitrite: NEGATIVE
Protein, ur: 100 mg/dL — AB
Specific Gravity, Urine: 1.02 (ref 1.005–1.030)
pH: 6 (ref 5.0–8.0)

## 2023-05-15 LAB — COMPREHENSIVE METABOLIC PANEL
ALT: 28 U/L (ref 0–44)
AST: 32 U/L (ref 15–41)
Albumin: 3.1 g/dL — ABNORMAL LOW (ref 3.5–5.0)
Alkaline Phosphatase: 51 U/L (ref 38–126)
Anion gap: 9 (ref 5–15)
BUN: 7 mg/dL (ref 6–20)
CO2: 24 mmol/L (ref 22–32)
Calcium: 9 mg/dL (ref 8.9–10.3)
Chloride: 102 mmol/L (ref 98–111)
Creatinine, Ser: 0.93 mg/dL (ref 0.44–1.00)
GFR, Estimated: >60 mL/min (ref >60)
Glucose, Bld: 86 mg/dL (ref 70–99)
Potassium: 4.3 mmol/L (ref 3.5–5.1)
Sodium: 135 mmol/L (ref 135–145)
Total Bilirubin: 1 mg/dL (ref 0.0–1.2)
Total Protein: 7.5 g/dL (ref 6.5–8.1)

## 2023-05-15 LAB — PREGNANCY, URINE: Preg Test, Ur: NEGATIVE

## 2023-05-15 LAB — URINE CULTURE: Culture: >=100000 — AB

## 2023-05-15 LAB — LIPASE, BLOOD: Lipase: 112 U/L — ABNORMAL HIGH (ref 11–51)

## 2023-05-15 MED ORDER — SODIUM CHLORIDE 0.9 % IV BOLUS
1000.0000 mL | Freq: Once | INTRAVENOUS | Status: AC
  Administered 2023-05-15: 1000 mL via INTRAVENOUS

## 2023-05-15 MED ORDER — KETOROLAC TROMETHAMINE 30 MG/ML IJ SOLN
30.0000 mg | Freq: Once | INTRAMUSCULAR | Status: AC
  Administered 2023-05-15: 30 mg via INTRAVENOUS
  Filled 2023-05-15: qty 1

## 2023-05-15 NOTE — Discharge Instructions (Addendum)
 Continue your antibiotic until it is done.  It is ok to take 600 mg ibuprofen every 6 hours for pain.

## 2023-05-15 NOTE — ED Triage Notes (Signed)
 Pt c/o left mid back pain that wraps around to right lower abdomen started yesterday. Pt denies urinary issues. Pt states the pain is worse with a deep breath or with a hiccups

## 2023-05-15 NOTE — ED Notes (Signed)
 ..  The patient is A&OX4, ambulatory at d/c with independent steady gait, NAD. Pt verbalized understanding of d/c instructions and follow up care.

## 2023-05-15 NOTE — ED Provider Notes (Signed)
 [place] EMERGENCY DEPARTMENT AT [facility] Provider Note   CSN: [id number] Arrival date & time: 05/15/23  1511     History  Chief Complaint  Patient presents with   Back Pain    Morgan Ramirez is a 33 y.o. female.  Pt is a 33 yo female with pmhx significant for migraines, back problems and recent dx of pyelo.  Pt was here in the ED on 3/24 and was diagnosed with pyelo.  She was given iv rocephin and d/c with keflex.  Urine cultured and was e.coli pan-sensitive.  Pt said she's been taking her meds.  There was a questionable Renal stone on ct, but there was no obstruction and pt was not septic.  Pt said she still has some pain radiating around from her back to her abd.  Pt also has some neck pain (side of neck bilat).  No fevers.       Home Medications Prior to Admission medications   Medication Sig Start Date End Date Taking? Authorizing Provider  acetaminophen (TYLENOL) 500 MG tablet Take 1,000 mg by mouth every 8 (eight) hours as needed for mild pain (pain score 1-3) or moderate pain (pain score 4-6).   Yes [provider]  aspirin-acetaminophen-caffeine (EXCEDRIN MIGRAINE) [id number] MG tablet Take 1-2 tablets by mouth every 6 (six) hours as needed for headache.   Yes [provider]  AVIANE 0.1-20 MG-MCG tablet Take 1 tablet by mouth daily. Patient taking differently: Take 1 tablet by mouth in the morning. 04/26/23  Yes Chrzanowski, Jami B, NP  cephALEXin (KEFLEX) 500 MG capsule Take 1 capsule (500 mg total) by mouth 4 (four) times daily. 05/13/23  Yes Zadie Rhine, MD  omeprazole (PRILOSEC OTC) 20 MG tablet Take 20 mg by mouth as needed.   Yes [provider]  busPIRone (BUSPAR) 5 MG tablet Take 1 tablet (5 mg total) by mouth 3 (three) times daily as needed. Patient not taking: Reported on 05/15/2023 04/26/23   Elenore Paddy, NP      Allergies    Patient has no known allergies.    Review of Systems   Review of Systems   Gastrointestinal:  Positive for abdominal pain.  Musculoskeletal:  Positive for back pain and neck pain.  All other systems reviewed and are negative.   Physical Exam Updated Vital Signs BP 133/72   Pulse 89   Temp 98 F (36.7 C)   Resp 15   Ht 5\' 3"  (1.6 m)   Wt 84.8 kg   LMP 04/29/2023   SpO2 100%   BMI 33.12 kg/m  Physical Exam Vitals and nursing note reviewed.  Constitutional:      Appearance: Normal appearance.  HENT:     Head: Normocephalic and atraumatic.     Right Ear: External ear normal.     Left Ear: External ear normal.     Nose: Nose normal.     Mouth/Throat:     Mouth: Mucous membranes are moist.     Pharynx: Oropharynx is clear.  Eyes:     Extraocular Movements: Extraocular movements intact.     Conjunctiva/sclera: Conjunctivae normal.     Pupils: Pupils are equal, round, and reactive to light.  Neck:     Comments: Mild scm tenderness Cardiovascular:     Rate and Rhythm: Normal rate and regular rhythm.     Pulses: Normal pulses.     Heart sounds: Normal heart sounds.  Pulmonary:     Effort: Pulmonary effort is normal.  Breath sounds: Normal breath sounds.  Abdominal:     General: Abdomen is flat. Bowel sounds are normal.     Palpations: Abdomen is soft.  Musculoskeletal:        General: Normal range of motion.     Cervical back: Normal range of motion and neck supple.  Skin:    General: Skin is warm.     Capillary Refill: Capillary refill takes less than 2 seconds.  Neurological:     General: No focal deficit present.     Mental Status: [name] is alert and oriented to person, place, and time.  Psychiatric:        Mood and Affect: Mood normal.        Behavior: Behavior normal.     ED Results / Procedures / Treatments   Labs (all labs ordered are listed, but only abnormal results are displayed) Labs Reviewed  CBC WITH DIFFERENTIAL/PLATELET - Abnormal; Notable for the following components:      Result Value   WBC 12.4 (*)     Neutro Abs 9.3 (*)    Abs Immature Granulocytes 0.10 (*)    All other components within normal limits  COMPREHENSIVE METABOLIC PANEL - Abnormal; Notable for the following components:   Albumin 3.1 (*)    All other components within normal limits  LIPASE, BLOOD - Abnormal; Notable for the following components:   Lipase 112 (*)    All other components within normal limits  URINALYSIS, ROUTINE W REFLEX MICROSCOPIC - Abnormal; Notable for the following components:   APPearance HAZY (*)    Hgb urine dipstick LARGE (*)    Ketones, ur 20 (*)    Protein, ur 100 (*)    Leukocytes,Ua MODERATE (*)    Bacteria, UA FEW (*)    All other components within normal limits  PREGNANCY, URINE    EKG None  Radiology CT Renal Stone Study Result Date: 05/15/2023 CLINICAL DATA:  Left-sided flank pain EXAM: CT ABDOMEN AND PELVIS WITHOUT CONTRAST TECHNIQUE: Multidetector CT imaging of the abdomen and pelvis was performed following the standard protocol without IV contrast. RADIATION DOSE REDUCTION: This exam was performed according to the departmental dose-optimization program which includes automated exposure control, adjustment of the mA and/or kV according to patient size and/or use of iterative reconstruction technique. COMPARISON:  CT 05/13/2023 FINDINGS: Lower chest: No acute abnormality. Hepatobiliary: No focal liver abnormality is seen. No gallstones, gallbladder wall thickening, or biliary dilatation. Pancreas: Unremarkable. No pancreatic ductal dilatation or surrounding inflammatory changes. Spleen: Normal in size without focal abnormality. Adrenals/Urinary Tract: Adrenal glands are normal. Trace perinephric stranding about the bilateral kidneys. No hydronephrosis. Slightly thick-walled appearance of urinary bladder. Stomach/Bowel: Stomach is within normal limits. Appendix appears normal. No evidence of bowel wall thickening, distention, or inflammatory changes. Vascular/Lymphatic: No significant vascular  findings are present. No enlarged abdominal or pelvic lymph nodes. Reproductive: Uterus and bilateral adnexa are unremarkable. Other: No free air.  Small volume free fluid in the pelvis Musculoskeletal: No acute or significant osseous findings. IMPRESSION: 1. Negative for hydronephrosis or ureteral stone. Slightly thick-walled appearance of urinary bladder, correlate with urinalysis to exclude cystitis. Trace perinephric stranding bilaterally, nonspecific but could be seen with upper urinary tract infection. 2. Small volume free fluid in the pelvis, likely physiologic. Electronically Signed   By: Jasmine Pang M.D.   On: 05/15/2023 21:03    Procedures Procedures    Medications Ordered in ED Medications  sodium chloride 0.9 % bolus 1,000 mL (0 mLs Intravenous Stopped  05/15/23 1830)  ketorolac (TORADOL) 30 MG/ML injection 30 mg (30 mg Intravenous Given 05/15/23 1710)    ED Course/ Medical Decision Making/ A&P                                 Medical Decision Making Amount and/or Complexity of Data Reviewed Labs: ordered. Radiology: ordered.  Risk Prescription drug management.   This patient presents to the ED for concern of abd pain, this involves an extensive number of treatment options, and is a complaint that carries with it a high risk of complications and morbidity.  The differential diagnosis includes pyelo, stone, uti, pregnancy   Co morbidities that complicate the patient evaluation  migraines, back problems and recent dx of pyelo   Additional history obtained:  Additional history obtained from epic chart review  Lab Tests:  I Ordered, and personally interpreted labs.  The pertinent results include:  cbc with wbc elevated at 12.4, ua has improved with 21-50 wbcs, cmp nl   Imaging Studies ordered:  I ordered imaging studies including ct renal  I independently visualized and interpreted imaging which showed   Negative for hydronephrosis or ureteral stone. Slightly   thick-walled appearance of urinary bladder, correlate with  urinalysis to exclude cystitis. Trace perinephric stranding  bilaterally, nonspecific but could be seen with upper urinary tract  infection.  2. Small volume free fluid in the pelvis, likely physiologic.   I agree with the radiologist interpretation   Cardiac Monitoring:  The patient was maintained on a cardiac monitor.  I personally viewed and interpreted the cardiac monitored which showed an underlying rhythm of: nsr   Medicines ordered and prescription drug management:  I ordered medication including ivfs/toradol  for sx  Reevaluation of the patient after these medicines showed that the patient improved I have reviewed the patients home medicines and have made adjustments as needed   Test Considered:  ct   Critical Interventions:  ivfs   Problem List / ED Course:  Flank pain:  likely uti/pyelo.  No evidence of stone.  She has only had 2 days of abx.  Urine sensitive to keflex.  She is encouraged to continue taking keflex.  She has only been taking tylenol for pain.  She is told to add ibuprofen.  She is stable for d/c.  Return if worse. F/u with pcp.   Reevaluation:  After the interventions noted above, I reevaluated the patient and found that they have :improved   Social Determinants of Health:  Lives at home   Dispostion:  After consideration of the diagnostic results and the patients response to treatment, I feel that the patent would benefit from discharge with outpatient f/u.          Final Clinical Impression(s) / ED Diagnoses Final diagnoses:  Acute cystitis without hematuria  Strain of neck muscle, initial encounter    Rx / DC Orders ED Discharge Orders     None         Jacalyn Lefevre, MD 05/15/23 2128

## 2023-05-16 ENCOUNTER — Telehealth (HOSPITAL_BASED_OUTPATIENT_CLINIC_OR_DEPARTMENT_OTHER): Payer: Self-pay

## 2023-05-16 NOTE — Telephone Encounter (Signed)
 Post ED Visit - Positive Culture Follow-up  Culture report reviewed by antimicrobial stewardship pharmacist: [facility] [x]  Daylene Posey, Pharm.D. []  Celedonio Miyamoto, Pharm.D., BCPS AQ-ID []  Garvin Fila, Pharm.D., BCPS []  Georgina Pillion, [address].D., BCPS []  [place], [address].D., BCPS, AAHIVP []  Estella Husk, Pharm.D., BCPS, AAHIVP []  Lysle Pearl, PharmD, BCPS []  Phillips Climes, PharmD, BCPS []  Agapito Games, PharmD, BCPS []  Verlan Friends, PharmD []  Mervyn Gay, PharmD, BCPS []  Vinnie Level, PharmD  [facility] []  Len Childs, PharmD []  Greer Pickerel, PharmD []  Adalberto Cole, PharmD []  Perlie Gold, Rph []  Lonell Face) Jean Rosenthal, PharmD []  Earl Many, PharmD []  Junita Push, PharmD []  Dorna Leitz, PharmD []  Terrilee Files, PharmD []  Lynann Beaver, PharmD []  Keturah Barre, PharmD []  Loralee Pacas, PharmD []  Bernadene Person, PharmD   Positive urine culture Treated with Cephalexin, organism sensitive to the same and no further patient follow-up is required at this time.  Sandria Senter 05/16/2023, 9:08 AM

## 2023-05-30 ENCOUNTER — Ambulatory Visit: Admitting: Nurse Practitioner

## 2023-06-05 ENCOUNTER — Ambulatory Visit: Admitting: Nurse Practitioner

## 2023-09-11 DIAGNOSIS — Z113 Encounter for screening for infections with a predominantly sexual mode of transmission: Secondary | ICD-10-CM | POA: Diagnosis not present

## 2023-09-11 DIAGNOSIS — Z114 Encounter for screening for human immunodeficiency virus [HIV]: Secondary | ICD-10-CM | POA: Diagnosis not present

## 2023-09-30 ENCOUNTER — Ambulatory Visit (HOSPITAL_COMMUNITY)
Admission: EM | Admit: 2023-09-30 | Discharge: 2023-09-30 | Disposition: A | Discharge status: Home / Self Care | Attending: Emergency Medicine | Admitting: Emergency Medicine

## 2023-09-30 ENCOUNTER — Encounter (HOSPITAL_COMMUNITY): Payer: Self-pay

## 2023-09-30 DIAGNOSIS — R030 Elevated blood-pressure reading, without diagnosis of hypertension: Secondary | ICD-10-CM

## 2023-09-30 DIAGNOSIS — R079 Chest pain, unspecified: Secondary | ICD-10-CM

## 2023-09-30 MED ORDER — ALUM & MAG HYDROXIDE-SIMETH 200-200-20 MG/5ML PO SUSP
ORAL | Status: AC
  Filled 2023-09-30: qty 30

## 2023-09-30 MED ORDER — ALUM & MAG HYDROXIDE-SIMETH 200-200-20 MG/5ML PO SUSP
30.0000 mL | Freq: Once | ORAL | Status: AC
  Administered 2023-09-30 (×2): 30 mL via ORAL

## 2023-09-30 MED ORDER — FAMOTIDINE 20 MG PO TABS: 20.0000 mg | ORAL_TABLET | Freq: Two times a day (BID) | ORAL | 0 refills | Status: DC

## 2023-09-30 MED ORDER — AMLODIPINE BESYLATE 5 MG PO TABS: 5.0000 mg | ORAL_TABLET | Freq: Every day | ORAL | 1 refills | Status: DC

## 2023-09-30 MED ORDER — LIDOCAINE VISCOUS HCL 2 % MT SOLN
15.0000 mL | Freq: Once | OROMUCOSAL | Status: AC
  Administered 2023-09-30 (×2): 15 mL via OROMUCOSAL

## 2023-09-30 MED ORDER — LIDOCAINE VISCOUS HCL 2 % MT SOLN
OROMUCOSAL | Status: AC
Start: 2023-09-30 — End: 2023-09-30
  Filled 2023-09-30: qty 15

## 2023-09-30 NOTE — ED Provider Notes (Signed)
 [facility]    CSN: [id number] Arrival date & time: 09/30/23  1529      History   Chief Complaint Chief Complaint  Patient presents with   Chest Pain    Hard to take deep breaths    HPI Morgan Ramirez is a 33 y.o. female.   Patient presents with left-sided chest pain and tightness that began this morning.  Patient states that she is having some difficulty taking a deep breath due to the pain.  Patient states that she also has a mild headache that began this morning as well.  Patient states that she has had some mild nausea as well.  Patient does report a history of anxiety and wonders if this could be related to this.  Patient states that she did have an appointment with [facility] in last month and was told that her blood pressure was elevated at that time and patient is wondering if her chest pain could be related to this as well.  Denies palpitations, dizziness, weakness, persistent shortness of breath, cough, congestion, blurred vision, photophobia, vomiting, diarrhea, abdominal pain.  LMP 7/22.  The history is provided by the patient and medical records.  Chest Pain   Past Medical History:  Diagnosis Date   Bacterial vaginosis 06/22/2016   Cervical incompetence 08/05/2016   SAB at [redacted]w[redacted]d   Chronic back pain    Migraine    Trichimoniasis     Patient Active Problem List   Diagnosis Date Noted   Palpitations 04/26/2023   Encounter for general adult medical examination with abnormal findings 04/26/2023   Anxiety 04/26/2023   Class 1 obesity without serious comorbidity with body mass index (BMI) of 33.0 to 33.9 in adult 04/26/2023   Screening examination for STD (sexually transmitted disease) 04/26/2023   Atypical mole 04/26/2023   Alcohol abuse 04/26/2023   Cervical cancer screening 01/04/2022   Rash 01/04/2022   Hyponatremia 01/04/2022   Vaginal bleeding 08/10/2021   Moderate episode of recurrent major depressive disorder (HCC) 10/21/2020    Generalized anxiety disorder 10/21/2020   Cervical incompetence 08/05/2016    Past Surgical History:  Procedure Laterality Date   NO PAST SURGERIES      OB History     Gravida  2   Para  1   Term  1   Preterm      AB      Living  1      SAB      IAB      Ectopic      Multiple  0   Live Births  1        Obstetric Comments  Pt delivered in ambulance on way to hospital. Placenta was not delivered at time of arrival. Placenta delivered at 1255.          Home Medications    Prior to Admission medications   Medication Sig Start Date End Date Taking? Authorizing Provider  amLODipine  (NORVASC ) 5 MG tablet Take 1 tablet (5 mg total) by mouth daily. 09/30/23  Yes Johnie, Glenden Rossell A, NP  famotidine  (PEPCID ) 20 MG tablet Take 1 tablet (20 mg total) by mouth 2 (two) times daily. 09/30/23  Yes Johnie Flaming A, NP  acetaminophen  (TYLENOL ) 500 MG tablet Take 1,000 mg by mouth every 8 (eight) hours as needed for mild pain (pain score 1-3) or moderate pain (pain score 4-6).    [provider]  AVIANE  0.1-20 MG-MCG tablet Take 1 tablet by mouth daily. Patient taking  differently: Take 1 tablet by mouth in the morning. 04/26/23   Chrzanowski, Jami B, NP  busPIRone  (BUSPAR ) 5 MG tablet Take 1 tablet (5 mg total) by mouth 3 (three) times daily as needed. 04/26/23   Gray, Sarah E, NP  omeprazole (PRILOSEC OTC) 20 MG tablet Take 20 mg by mouth as needed.    [provider]    Family History Family History  Adopted: Yes  Family history unknown: Yes    Social History Social History   Tobacco Use   Smoking status: Every Day    Current packs/day: 0.25    Types: Cigarettes   Smokeless tobacco: Never  Vaping Use   Vaping status: Never Used  Substance Use Topics   Alcohol use: Yes    Comment: socially   Drug use: Not Currently    Types: Marijuana    Comment: daily     Allergies   Patient has no known allergies.   Review of Systems Review of  Systems  Cardiovascular:  Positive for chest pain.   Per HPI  Physical Exam Triage Vital Signs ED Triage Vitals  Encounter Vitals Group     BP 09/30/23 1704 (!) 143/86     Girls Systolic BP Percentile --      Girls Diastolic BP Percentile --      Boys Systolic BP Percentile --      Boys Diastolic BP Percentile --      Pulse Rate 09/30/23 1704 65     Resp 09/30/23 1704 16     Temp 09/30/23 1704 98.2 F (36.8 C)     Temp Source 09/30/23 1704 Oral     SpO2 09/30/23 1704 98 %     Weight --      Height --      Head Circumference --      Peak Flow --      Pain Score 09/30/23 1707 6     Pain Loc --      Pain Education --      Exclude from Growth Chart --    No data found.  Updated Vital Signs BP (!) 143/86 (BP Location: Left Arm)   Pulse 65   Temp 98.2 F (36.8 C) (Oral)   Resp 16   LMP 09/10/2023 (Approximate)   SpO2 98%   Visual Acuity Right Eye Distance:   Left Eye Distance:   Bilateral Distance:    Right Eye Near:   Left Eye Near:    Bilateral Near:     Physical Exam Vitals and nursing note reviewed.  Constitutional:      General: Morgan Ramirez is awake. Morgan Ramirez is not in acute distress.    Appearance: Normal appearance. Morgan Ramirez is well-developed and well-groomed. Morgan Ramirez is not ill-appearing.  HENT:     Nose: Nose normal.     Mouth/Throat:     Mouth: Mucous membranes are moist.     Pharynx: Oropharynx is clear.  Cardiovascular:     Rate and Rhythm: Normal rate and regular rhythm.  Pulmonary:     Effort: Pulmonary effort is normal.     Breath sounds: Normal breath sounds.  Chest:     Chest wall: No tenderness.  Abdominal:     General: Abdomen is flat. Bowel sounds are normal.     Palpations: Abdomen is soft.     Tenderness: There is no abdominal tenderness.  Musculoskeletal:        General: Normal range of motion.     Cervical back: Normal range  of motion and neck supple.  Skin:    General: Skin is warm and dry.  Neurological:     General: No  focal deficit present.     Mental Status: Morgan Ramirez is alert and oriented to person, place, and time. Mental status is at baseline.  Psychiatric:        Behavior: Behavior is cooperative.      UC Treatments / Results  Labs (all labs ordered are listed, but only abnormal results are displayed) Labs Reviewed - No data to display  EKG   Radiology No results found.  Procedures Procedures (including critical care time)  Medications Ordered in UC Medications  alum & mag hydroxide-simeth (MAALOX/MYLANTA) 200-200-20 MG/5ML suspension 30 mL (30 mLs Oral Given 09/30/23 1736)  lidocaine  (XYLOCAINE ) 2 % viscous mouth solution 15 mL (15 mLs Mouth/Throat Given 09/30/23 1736)    Initial Impression / Assessment and Plan / UC Course  I have reviewed the triage vital signs and the nursing notes.  Pertinent labs & imaging results that were available during my care of the patient were reviewed by me and considered in my medical decision making (see chart for details).     Patient is overall well-appearing.  Vitals are stable.  Blood pressure is elevated at 143/86.  No significant findings on exam.  Heart lung sounds normal.  EKG revealed normal sinus rhythm with sinus arrhythmia.  Without ST elevation, ST depression, or acute cardiac findings.  GI cocktail given in clinic with some relief.  Prescribed famotidine  for possible indigestion.  Prescribed low-dose amlodipine  for blood pressure management.  Discussed follow-up, return, and strict ER precautions. Final Clinical Impressions(s) / UC Diagnoses   Final diagnoses:  Chest pain, unspecified type  Elevated blood pressure reading in office without diagnosis of hypertension     Discharge Instructions      You can take famotidine  twice daily as needed for possible indigestion related to chest pain. Start taking amlodipine  once daily for blood pressure management.  I provided you a 30-day supply with 1 refill. Follow-up with your primary  care provider for further evaluation and management of your blood pressure. If you develop worsening chest pain, shortness of breath, excessive vomiting, severe abdominal pain, weakness, or passing out please seek immediate medical treatment in the emergency department.     ED Prescriptions     Medication Sig Dispense Auth. Provider   famotidine  (PEPCID ) 20 MG tablet Take 1 tablet (20 mg total) by mouth 2 (two) times daily. 30 tablet Johnie Flaming A, NP   amLODipine  (NORVASC ) 5 MG tablet Take 1 tablet (5 mg total) by mouth daily. 30 tablet Johnie Flaming A, NP      PDMP not reviewed this encounter.   Johnie Flaming A, NP 09/30/23 1806

## 2023-09-30 NOTE — ED Triage Notes (Signed)
 Patient here today with c/o left side chest tightness and difficult with taking deep breaths, and headache that started this morning. Patient states that she has a h/o anxiety but [facility] high.

## 2023-09-30 NOTE — Discharge Instructions (Addendum)
 You can take famotidine  twice daily as needed for possible indigestion related to chest pain. Start taking amlodipine  once daily for blood pressure management.  I provided you a 30-day supply with 1 refill. Follow-up with your primary care provider for further evaluation and management of your blood pressure. If you develop worsening chest pain, shortness of breath, excessive vomiting, severe abdominal pain, weakness, or passing out please seek immediate medical treatment in the emergency department.

## 2023-10-25 ENCOUNTER — Ambulatory Visit: Admitting: Nurse Practitioner

## 2023-10-25 ENCOUNTER — Ambulatory Visit: Payer: Self-pay | Admitting: Nurse Practitioner

## 2023-10-25 VITALS — Wt 184.5 lb

## 2023-10-25 DIAGNOSIS — I10 Essential (primary) hypertension: Secondary | ICD-10-CM

## 2023-10-25 DIAGNOSIS — F1010 Alcohol abuse, uncomplicated: Secondary | ICD-10-CM

## 2023-10-25 DIAGNOSIS — E010 Iodine-deficiency related diffuse (endemic) goiter: Secondary | ICD-10-CM | POA: Diagnosis not present

## 2023-10-25 DIAGNOSIS — K219 Gastro-esophageal reflux disease without esophagitis: Secondary | ICD-10-CM

## 2023-10-25 DIAGNOSIS — F419 Anxiety disorder, unspecified: Secondary | ICD-10-CM

## 2023-10-25 DIAGNOSIS — R079 Chest pain, unspecified: Secondary | ICD-10-CM

## 2023-10-25 LAB — CBC
HCT: 40.7 % (ref 36.0–46.0)
Hemoglobin: 12.9 g/dL (ref 12.0–15.0)
MCHC: 31.7 g/dL (ref 30.0–36.0)
MCV: 84.8 fl (ref 78.0–100.0)
Platelets: 361 K/uL (ref 150.0–400.0)
RBC: 4.8 Mil/uL (ref 3.87–5.11)
RDW: 14.4 % (ref 11.5–15.5)
WBC: 11.1 K/uL — ABNORMAL HIGH (ref 4.0–10.5)

## 2023-10-25 LAB — LIPID PANEL
Cholesterol: 148 mg/dL (ref 0–200)
HDL: 59.5 mg/dL (ref >39.00)
LDL Cholesterol: 78 mg/dL (ref 0–99)
NonHDL: 88.82
Total CHOL/HDL Ratio: 2
Triglycerides: 56 mg/dL (ref 0.0–149.0)
VLDL: 11.2 mg/dL (ref 0.0–40.0)

## 2023-10-25 LAB — COMPREHENSIVE METABOLIC PANEL WITH GFR
ALT: 14 U/L (ref 0–35)
AST: 22 U/L (ref 0–37)
Albumin: 4.3 g/dL (ref 3.5–5.2)
Alkaline Phosphatase: 72 U/L (ref 39–117)
BUN: 10 mg/dL (ref 6–23)
CO2: 22 meq/L (ref 19–32)
Calcium: 9.6 mg/dL (ref 8.4–10.5)
Chloride: 103 meq/L (ref 96–112)
Creatinine, Ser: 0.75 mg/dL (ref 0.40–1.20)
GFR: 105.65 mL/min (ref >60.00)
Glucose, Bld: 82 mg/dL (ref 70–99)
Potassium: 3.6 meq/L (ref 3.5–5.1)
Sodium: 138 meq/L (ref 135–145)
Total Bilirubin: 0.6 mg/dL (ref 0.2–1.2)
Total Protein: 8.1 g/dL (ref 6.0–8.3)

## 2023-10-25 LAB — TSH: TSH: 0.98 u[IU]/mL (ref 0.35–5.50)

## 2023-10-25 LAB — T3, FREE: T3, Free: 3.7 pg/mL (ref 2.3–4.2)

## 2023-10-25 LAB — T4, FREE: Free T4: 0.79 ng/dL (ref 0.60–1.60)

## 2023-10-25 NOTE — Assessment & Plan Note (Signed)
 Anxiety disorder Anxiety potentially exacerbated by alcohol use. Discussed alcohol's initial anxiety reduction but potential worsening over time. -Referral to psychiatry -Continue buspar  5 mg TID PRN

## 2023-10-25 NOTE — Progress Notes (Signed)
 Established Patient Office Visit  Subjective   Patient ID: Morgan Ramirez, female    DOB: [birth year]  Age: 33 y.o. MRN: [id number]  Chief Complaint  Patient presents with   Chest Pain    Discussed the use of AI scribe software for clinical note transcription with the patient, who gave verbal consent to proceed.  History of Present Illness Morgan Ramirez is a 33 year old with hypertension who presents with ongoing chest pain.  Chest pain - Intermittent episodes of chest tightness escalating to pain - Initial severe episode on August 11th, prompting urgent care evaluation. Eval identified elevated BP reading (143/86), normal heart sounds, normal EKG, no other acute cardiac findings noted - Associated symptoms during initial episode included cognitive difficulties and malaise - Chest pain appears to improve with dietary changes, including detox water, bananas, and watermelon -Chest pain is nonexertional  Hypertension - Diagnosed with elevated blood pressure, recorded as 143/86 at urgent care on August 11th - Initiated on amlodipine  5 mg daily for blood pressure control - No significant side effects from amlodipine  reported  Gastrointestinal symptoms - Indigestion and heartburn treated effectively with omeprazole and famotidine  - Received a GI cocktail at urgent care for initial symptoms  Substance use - Continues to smoke cigarettes - Recent increase in alcohol consumption, currently drinking approximately a pint of liquor daily - Family history of alcohol use - Open to seeking help for alcohol use      ROS: see HPI    Objective:     Wt 184 lb 8 oz (83.7 kg)   LMP 09/10/2023 (Approximate)   BMI 32.68 kg/m  BP Readings from Last 3 Encounters:  09/30/23 (!) 143/86  05/15/23 125/65  05/13/23 127/75   Wt Readings from Last 3 Encounters:  10/25/23 184 lb 8 oz (83.7 kg)  05/15/23 186 lb 15.2 oz (84.8 kg)  05/13/23 186 lb 15.2 oz (84.8 kg)      Physical  Exam Vitals reviewed.  Constitutional:      General: Morgan Ramirez is not in acute distress.    Appearance: Normal appearance.  HENT:     Head: Normocephalic and atraumatic.  Neck:     Thyroid : Thyromegaly present.     Vascular: No carotid bruit.  Cardiovascular:     Rate and Rhythm: Normal rate and regular rhythm.     Pulses: Normal pulses.     Heart sounds: Normal heart sounds.  Pulmonary:     Effort: Pulmonary effort is normal.     Breath sounds: Normal breath sounds.  Skin:    General: Skin is warm and dry.  Neurological:     General: No focal deficit present.     Mental Status: Morgan Ramirez is alert and oriented to person, place, and time.  Psychiatric:        Mood and Affect: Mood normal.        Behavior: Behavior normal.        Judgment: Judgment normal.   EKG: NSR   Results for orders placed or performed in visit on 10/25/23  Lipid panel  Result Value Ref Range   Cholesterol 148 0 - 200 mg/dL   Triglycerides 43.9 0.0 - 149.0 mg/dL   HDL 40.49 >60.99 mg/dL   VLDL 88.7 0.0 - 59.9 mg/dL   LDL Cholesterol 78 0 - 99 mg/dL   Total CHOL/HDL Ratio 2    NonHDL 88.82   T4, free  Result Value Ref Range   Free T4 0.79 0.60 - 1.60 ng/dL  T3,  free  Result Value Ref Range   T3, Free 3.7 2.3 - 4.2 pg/mL  TSH  Result Value Ref Range   TSH 0.98 0.35 - 5.50 uIU/mL  Comprehensive metabolic panel with GFR  Result Value Ref Range   Sodium 138 135 - 145 mEq/L   Potassium 3.6 3.5 - 5.1 mEq/L   Chloride 103 96 - 112 mEq/L   CO2 22 19 - 32 mEq/L   Glucose, Bld 82 70 - 99 mg/dL   BUN 10 6 - 23 mg/dL   Creatinine, Ser 9.24 0.40 - 1.20 mg/dL   Total Bilirubin 0.6 0.2 - 1.2 mg/dL   Alkaline Phosphatase 72 39 - 117 U/L   AST 22 0 - 37 U/L   ALT 14 0 - 35 U/L   Total Protein 8.1 6.0 - 8.3 g/dL   Albumin 4.3 3.5 - 5.2 g/dL   GFR 894.34 >39.99 mL/min   Calcium  9.6 8.4 - 10.5 mg/dL  CBC  Result Value Ref Range   WBC 11.1 (H) 4.0 - 10.5 K/uL   RBC 4.80 3.87 - 5.11 Mil/uL   Platelets  361.0 150.0 - 400.0 K/uL   Hemoglobin 12.9 12.0 - 15.0 g/dL   HCT 59.2 63.9 - 53.9 %   MCV 84.8 78.0 - 100.0 fl   MCHC 31.7 30.0 - 36.0 g/dL   RDW 85.5 88.4 - 84.4 %      The ASCVD Risk score (Arnett DK, et al., 2019) failed to calculate for the following reasons:   The 2019 ASCVD risk score is only valid for ages 29 to 34    Assessment & Plan:   Problem List Items Addressed This Visit       Cardiovascular and Mediastinum   HTN (hypertension)   Essential hypertension Hypertension initially noted at 143/86 mmHg, now 130/80 mmHg. Improved with amlodipine  and lifestyle changes. Smoking and alcohol are contributing factors. Amlodipine  well tolerated. - Continue amlodipine  5 mg daily. - Encourage lifestyle modifications including diet changes and reduction of alcohol and tobacco use        Digestive   GERD (gastroesophageal reflux disease)   Chest pain likely secondary to gastroesophageal reflux disease and alcohol use Intermittent chest pain likely due to GERD and alcohol use. Symptoms improve with dietary changes and medication. No exertional component, reassuring against cardiac etiology. Smoking and alcohol are contributing factors. Discussed potential esophageal or gastric inflammation and benefits of endoscopy. - Continue omeprazole as needed before meals. - Continue famotidine  as needed for breakthrough symptoms. - Refer to gastroenterology for possible endoscopy -Referral to cardiology per patient request as well for further evaluation       Relevant Orders   Ambulatory referral to Cardiology   Ambulatory referral to Gastroenterology   CBC (Completed)   Comprehensive metabolic panel with GFR (Completed)   TSH (Completed)   T3, free (Completed)   T4, free (Completed)   US  THYROID    Lipid panel (Completed)     Endocrine   Thyromegaly   Thyroid  enlargement, unspecified etiology Slightly enlarged thyroid  with normal function tests. Discussed potential impact on mood  and cardiac symptoms. - Order thyroid  ultrasound to assess for structural abnormalities.      Relevant Orders   CBC (Completed)   Comprehensive metabolic panel with GFR (Completed)   TSH (Completed)   T3, free (Completed)   T4, free (Completed)   US  THYROID    Lipid panel (Completed)     Other   Anxiety   Anxiety disorder Anxiety potentially exacerbated by  alcohol use. Discussed alcohol's initial anxiety reduction but potential worsening over time. -Referral to psychiatry -Continue buspar  5 mg TID PRN      Relevant Orders   Ambulatory referral to Cardiology   Ambulatory referral to Gastroenterology   Ambulatory referral to Psychiatry   CBC (Completed)   Comprehensive metabolic panel with GFR (Completed)   TSH (Completed)   T3, free (Completed)   T4, free (Completed)   US  THYROID    Lipid panel (Completed)   Alcohol abuse   Alcohol use disorder Increased alcohol consumption contributing to hypertension, anxiety, and GERD. They acknowledge the problem and are open to reducing intake. Discussed alcohol's impact on health and encouraged AA participation. Offered psychiatry referral. - Encourage reduction of alcohol intake with the goal of cessation. - Discuss potential benefits of Alcoholics Anonymous (AA) and encourage attendance. - Refer to psychiatry for assistance with alcohol use disorder.      Relevant Orders   Ambulatory referral to Cardiology   Ambulatory referral to Gastroenterology   Ambulatory referral to Psychiatry   CBC (Completed)   Comprehensive metabolic panel with GFR (Completed)   TSH (Completed)   T3, free (Completed)   T4, free (Completed)   US  THYROID    Lipid panel (Completed)   Chest pain - Primary   Chest pain likely secondary to gastroesophageal reflux disease and alcohol use Intermittent chest pain likely due to GERD and alcohol use. Symptoms improve with dietary changes and medication. No exertional component, reassuring against cardiac  etiology. Smoking and alcohol are contributing factors. Discussed potential esophageal or gastric inflammation and benefits of endoscopy. - Continue omeprazole as needed before meals. - Continue famotidine  as needed for breakthrough symptoms. - Refer to gastroenterology for possible endoscopy -Referral to cardiology per patient request as well for further evaluation       Relevant Orders   Ambulatory referral to Cardiology   Ambulatory referral to Gastroenterology   CBC (Completed)   Comprehensive metabolic panel with GFR (Completed)   TSH (Completed)   T3, free (Completed)   T4, free (Completed)   US  THYROID    Lipid panel (Completed)  Assessment and Plan Assessment & Plan Chest pain likely secondary to gastroesophageal reflux disease and alcohol use Intermittent chest pain likely due to GERD and alcohol use. Symptoms improve with dietary changes and medication. No exertional component, reassuring against cardiac etiology. Smoking and alcohol are contributing factors. Discussed potential esophageal or gastric inflammation and benefits of endoscopy. - Continue omeprazole as needed before meals. - Continue famotidine  as needed for breakthrough symptoms. - Refer to gastroenterology for possible endoscopy -Referral to cardiology per patient request as well for further evaluation   Essential hypertension Hypertension initially noted at 143/86 mmHg, now 130/80 mmHg. Improved with amlodipine  and lifestyle changes. Smoking and alcohol are contributing factors. Amlodipine  well tolerated. - Continue amlodipine  5 mg daily. - Encourage lifestyle modifications including diet changes and reduction of alcohol and tobacco use.  Alcohol use disorder Increased alcohol consumption contributing to hypertension, anxiety, and GERD. They acknowledge the problem and are open to reducing intake. Discussed alcohol's impact on health and encouraged AA participation. Offered psychiatry referral. - Encourage  reduction of alcohol intake with the goal of cessation. - Discuss potential benefits of Alcoholics Anonymous (AA) and encourage attendance. - Refer to psychiatry for assistance with alcohol use disorder.  Tobacco use Continued tobacco use contributing to hypertension and cardiac risk. Discussed smoking's impact on health.  Anxiety disorder Anxiety potentially exacerbated by alcohol use. Discussed alcohol's initial anxiety reduction but  potential worsening over time. -Referral to psychiatry -Continue buspar  5 mg TID PRN  Thyroid  enlargement, unspecified etiology Slightly enlarged thyroid  with normal function tests. Discussed potential impact on mood and cardiac symptoms. - Order thyroid  ultrasound to assess for structural abnormalities.    Return in about 3 months (around 01/24/2024) for F/U with Lauraine.    Lauraine FORBES Pereyra, NP

## 2023-10-25 NOTE — Assessment & Plan Note (Signed)
 Alcohol use disorder Increased alcohol consumption contributing to hypertension, anxiety, and GERD. They acknowledge the problem and are open to reducing intake. Discussed alcohol's impact on health and encouraged AA participation. Offered psychiatry referral. - Encourage reduction of alcohol intake with the goal of cessation. - Discuss potential benefits of Alcoholics Anonymous (AA) and encourage attendance. - Refer to psychiatry for assistance with alcohol use disorder.

## 2023-10-25 NOTE — Assessment & Plan Note (Signed)
 Chest pain likely secondary to gastroesophageal reflux disease and alcohol use Intermittent chest pain likely due to GERD and alcohol use. Symptoms improve with dietary changes and medication. No exertional component, reassuring against cardiac etiology. Smoking and alcohol are contributing factors. Discussed potential esophageal or gastric inflammation and benefits of endoscopy. - Continue omeprazole as needed before meals. - Continue famotidine  as needed for breakthrough symptoms. - Refer to gastroenterology for possible endoscopy -Referral to cardiology per patient request as well for further evaluation

## 2023-10-25 NOTE — Assessment & Plan Note (Signed)
 Essential hypertension Hypertension initially noted at 143/86 mmHg, now 130/80 mmHg. Improved with amlodipine  and lifestyle changes. Smoking and alcohol are contributing factors. Amlodipine  well tolerated. - Continue amlodipine  5 mg daily. - Encourage lifestyle modifications including diet changes and reduction of alcohol and tobacco use

## 2023-10-25 NOTE — Assessment & Plan Note (Signed)
 Thyroid  enlargement, unspecified etiology Slightly enlarged thyroid  with normal function tests. Discussed potential impact on mood and cardiac symptoms. - Order thyroid  ultrasound to assess for structural abnormalities.

## 2023-10-28 NOTE — Addendum Note (Signed)
 Addended by: LEAR, Larraine Argo P on: 10/28/2023 01:24 PM   Modules accepted: Orders

## 2023-10-31 ENCOUNTER — Ambulatory Visit: Admitting: Nurse Practitioner

## 2023-11-05 ENCOUNTER — Ambulatory Visit
Admission: RE | Admit: 2023-11-05 | Discharge: 2023-11-05 | Disposition: A | Discharge status: Home / Self Care | Source: Ambulatory Visit | Attending: Nurse Practitioner | Admitting: Nurse Practitioner

## 2023-11-05 DIAGNOSIS — E010 Iodine-deficiency related diffuse (endemic) goiter: Secondary | ICD-10-CM

## 2023-11-05 DIAGNOSIS — R079 Chest pain, unspecified: Secondary | ICD-10-CM

## 2023-11-05 DIAGNOSIS — K219 Gastro-esophageal reflux disease without esophagitis: Secondary | ICD-10-CM

## 2023-11-05 DIAGNOSIS — F1010 Alcohol abuse, uncomplicated: Secondary | ICD-10-CM

## 2023-11-05 DIAGNOSIS — E0789 Other specified disorders of thyroid: Secondary | ICD-10-CM | POA: Diagnosis not present

## 2023-11-05 DIAGNOSIS — F419 Anxiety disorder, unspecified: Secondary | ICD-10-CM

## 2023-12-12 ENCOUNTER — Other Ambulatory Visit: Payer: Self-pay | Admitting: Nurse Practitioner

## 2023-12-12 NOTE — Telephone Encounter (Unsigned)
 Copied from CRM [id number]. Topic: Clinical - Medication Refill >> Dec 12, 2023 10:05 AM [place] J wrote: Medication:  amLODipine  (NORVASC ) 5 MG tablet famotidine  (PEPCID ) 20 MG tablet  Has the patient contacted their pharmacy? Yes (Agent: If no, request that the patient contact the pharmacy for the refill. If patient does not wish to contact the pharmacy document the reason why and proceed with request.) (Agent: If yes, when and what did the pharmacy advise?) The pharmacy did not have refills for these medication.  This is the patient's preferred pharmacy:  [id number] - [place] ([place]), [place] - [address] [place] ([place]) [place] [address] Phone: [phone] Fax: [phone]  Is this the correct pharmacy for this prescription? Yes If no, delete pharmacy and type the correct one.   Has the prescription been filled recently? No  Is the patient out of the medication? Yes  Has the patient been seen for an appointment in the last year OR does the patient have an upcoming appointment? Yes  Can we respond through MyChart? Yes  Agent: Please be advised that Rx refills may take up to 3 business days. We ask that you follow-up with your pharmacy.

## 2023-12-13 MED ORDER — FAMOTIDINE 20 MG PO TABS: 20.0000 mg | ORAL_TABLET | Freq: Two times a day (BID) | ORAL | 0 refills | Status: AC

## 2023-12-13 MED ORDER — AMLODIPINE BESYLATE 5 MG PO TABS: 5.0000 mg | ORAL_TABLET | Freq: Every day | ORAL | 1 refills | Status: DC

## 2024-01-24 ENCOUNTER — Ambulatory Visit: Admitting: Nurse Practitioner

## 2024-01-31 ENCOUNTER — Other Ambulatory Visit: Payer: Self-pay | Admitting: Nurse Practitioner

## 2024-02-11 ENCOUNTER — Other Ambulatory Visit: Payer: Self-pay | Admitting: Nurse Practitioner

## 2024-02-16 ENCOUNTER — Other Ambulatory Visit: Payer: Self-pay | Admitting: Radiology

## 2024-02-16 DIAGNOSIS — Z3041 Encounter for surveillance of contraceptive pills: Secondary | ICD-10-CM

## 2024-02-17 NOTE — Telephone Encounter (Signed)
 Med refill request:   AVIANE  0.1-20 MG-MCG tablet  Start:  04/26/23 Disp: 84 tablets Refills:  2  Last OV: 12/18/22 Last AEX:  04/24/22 Next AEX:  Not yet scheduled *Front desk has been asked to contact Morgan Ramirez to schedule an annual visit.  Last MMG (if hormonal med):  N/A Refill authorized? Please Advise.

## 2024-02-24 NOTE — Telephone Encounter (Signed)
 Patient left message stating she has scheduled AEX for 04/03/24, requesting RF.   Spoke with patient, states she has been off OCP for 10 days total, menses started 02/19/24.   Recommended moving AEX to earlier date, patient agreeable. AEX scheduled for 02/25/24 at 0800. Advised keep AEX for refills. Will send update to Jami. Myles Cozier is out of the office today, returning 02/25/24. Patient agreeable.   Routing to provider for final review. Patient is agreeable to disposition. Will close encounter.

## 2024-02-24 NOTE — Progress Notes (Signed)
 " Cardiology Office Note:  .   Date:  02/25/2024  ID:  Morgan Ramirez, DOB 08/10/1990, MRN [id number] PCP: Elnor Lauraine BRAVO, NP  [facility] Providers Cardiologist:  None {  History of Present Illness: .   Morgan Ramirez is a 34 y.o. female with PMH hypertension who is seen as a new patient consultation for chest pain at the request of Lauraine Elnor, NP.  Referral from 10/25/23 reviewed. Noted intermittent episodes of chest tightness, seen in urgent care 09/30/23. Reportedly symptoms improved with GI cocktail. Pain noted to be nonexertional, symptoms improved with dietary changes. Noted that tobacco use, significant alcohol use likely exacerbating factors.  Today: Stopped smoking marijuana but noted that this increased her alcohol use for a time. Her boss commented on her pattern, and she had been working to cut back. She is not sure how much she is drinking now, still drinking daily, but now drinks a small bottle (she thinks about a pint) rather than larger bottle.  Chest pain was rare, has only had one severe episode for which she went to ER in August. No significant chest pain since then. Feels improved since starting GI medications. Has changed her diet. Has not been taking anxiety medication often so not sure if this helps. Discomfort had been tightness, feeling like it was hard to take a deep breath.   No personal history of heart issues. She is adopted, unclear about her family history.  ROS: Denies shortness of breath at rest or with normal exertion. No PND, orthopnea, LE edema or unexpected weight gain. No syncope or palpitations. ROS otherwise negative except as noted.   Studies Reviewed: SABRA    EKG:       Physical Exam:   VS:  BP 112/66   Pulse (!) 101   Ht 5' 2.75 (1.594 m)   Wt 187 lb (84.8 kg)   SpO2 99%   BMI 33.39 kg/m    Wt Readings from Last 3 Encounters:  02/25/24 187 lb (84.8 kg)  02/25/24 187 lb (84.8 kg)  10/25/23 184 lb 8 oz (83.7 kg)    GEN: Well nourished,  well developed in no acute distress HEENT: Normal, moist mucous membranes NECK: No JVD CARDIAC: regular rhythm, normal S1 and S2, no rubs or gallops. No murmur. VASCULAR: Radial and DP pulses 2+ bilaterally. No carotid bruits RESPIRATORY:  Clear to auscultation without rales, wheezing or rhonchi  ABDOMEN: Soft, non-tender, non-distended MUSCULOSKELETAL:  Ambulates independently SKIN: Warm and dry, no edema NEUROLOGIC:  Alert and oriented x 3. No focal neuro deficits noted. PSYCHIATRIC:  Normal affect    ASSESSMENT AND PLAN: .    Chest pain -no further symptoms since recent ER evaluation -symptoms do not suggest cardiac etiology, and she is overall low risk. No further testing needed at this time -highly suggestive of GI etiology given improvement with medications, diet changes -we did discuss alcohol use today, which qualifies her as alcohol abuse given amounts/frequency. She is trying to work on this but does not feel she is in a place to completely abstain. Recommended she continue to cut back as much as she can and work slowly towards long term abstinence -reviewed red flag warning signs that need immediate medical attention  Hypertension -well controlled on amlodipine   CV risk counseling and prevention -recommend heart healthy/Mediterranean diet, with whole grains, fruits, vegetable, fish, lean meats, nuts, and olive oil. Limit salt. -recommend moderate walking, 3-5 times/week for 30-50 minutes each session. Aim for at least 150 minutes/week.  Goal should be pace of 3 miles/hours, or walking 1.5 miles in 30 minutes -recommend avoidance of tobacco products. Avoid excess alcohol. -ASCVD risk score: The ASCVD Risk score (Arnett DK, et al., 2019) failed to calculate for the following reasons:   The 2019 ASCVD risk score is only valid for ages 32 to 80    Dispo: as needed  Signed, Shelda Bruckner, MD   Shelda Bruckner, MD, PhD, [facility]  [facility]  [facility] at [facility] [address] [place], [place] [address] [phone]   "

## 2024-02-25 ENCOUNTER — Encounter: Payer: Self-pay | Admitting: Radiology

## 2024-02-25 ENCOUNTER — Ambulatory Visit (INDEPENDENT_AMBULATORY_CARE_PROVIDER_SITE_OTHER): Admitting: Cardiology

## 2024-02-25 ENCOUNTER — Ambulatory Visit (INDEPENDENT_AMBULATORY_CARE_PROVIDER_SITE_OTHER): Admitting: Radiology

## 2024-02-25 ENCOUNTER — Encounter (HOSPITAL_BASED_OUTPATIENT_CLINIC_OR_DEPARTMENT_OTHER): Payer: Self-pay | Admitting: Cardiology

## 2024-02-25 VITALS — BP 120/70 | HR 100 | Ht 62.75 in | Wt 187.0 lb

## 2024-02-25 VITALS — BP 112/66 | HR 101 | Ht 62.75 in | Wt 187.0 lb

## 2024-02-25 DIAGNOSIS — Z01419 Encounter for gynecological examination (general) (routine) without abnormal findings: Secondary | ICD-10-CM

## 2024-02-25 DIAGNOSIS — I10 Essential (primary) hypertension: Secondary | ICD-10-CM

## 2024-02-25 DIAGNOSIS — F1010 Alcohol abuse, uncomplicated: Secondary | ICD-10-CM

## 2024-02-25 DIAGNOSIS — Z7189 Other specified counseling: Secondary | ICD-10-CM | POA: Diagnosis not present

## 2024-02-25 DIAGNOSIS — Z1331 Encounter for screening for depression: Secondary | ICD-10-CM | POA: Diagnosis not present

## 2024-02-25 DIAGNOSIS — Z113 Encounter for screening for infections with a predominantly sexual mode of transmission: Secondary | ICD-10-CM

## 2024-02-25 DIAGNOSIS — R079 Chest pain, unspecified: Secondary | ICD-10-CM

## 2024-02-25 DIAGNOSIS — Z3041 Encounter for surveillance of contraceptive pills: Secondary | ICD-10-CM

## 2024-02-25 MED ORDER — AVIANE 0.1-20 MG-MCG PO TABS: 1.0000 | ORAL_TABLET | Freq: Every day | ORAL | 4 refills | Status: AC

## 2024-02-25 NOTE — Patient Instructions (Signed)
Medication Instructions:  No changes *If you need a refill on your cardiac medications before your next appointment, please call your pharmacy*   Lab Work: none   Testing/Procedures: none   Follow-Up: As needed   

## 2024-02-25 NOTE — Patient Instructions (Signed)

## 2024-02-25 NOTE — Progress Notes (Signed)
 "  Morgan Ramirez [birth year] [id number]   History:  34 y.o. G2P1 presents for annual exam. Has a new partner, would like STI screen. No symptoms. Needs refill on OCPs.  Gynecologic History Patient's last menstrual period was 02/19/2024 (exact date). Period Cycle (Days):  (continuous COC, periods every 3 months) Period Duration (Days): 5-7 Period Pattern: Regular Menstrual Flow: Moderate Menstrual Control: Thin pad, Maxi pad Dysmenorrhea: None Contraception/Family planning: OCP (estrogen/progesterone) Sexually active: yes Last Pap: 04/24/22. Results were: normal   Obstetric History OB History  Gravida Para Term Preterm AB Living  2 1 1   1   SAB IAB Ectopic Multiple Live Births     0 1    # Outcome Date GA Lbr Len/2nd Weight Sex Type Anes PTL Lv  2 Gravida           1 Term 10/15/15 [redacted]w[redacted]d 13:38 / 02:00 7 lb 0.2 oz (3.18 kg) M Vag-Spont None  LIV     Birth Comments: molding on head    Obstetric Comments  Pt delivered in ambulance on way to hospital. Placenta was not delivered at time of arrival. Placenta delivered at 1255.       02/25/2024    8:06 AM 04/26/2023    3:57 PM 01/04/2022   10:49 AM  Depression screen PHQ 2/9  Decreased Interest 0 0 1  Down, Depressed, Hopeless 0 0 1  PHQ - 2 Score 0 0 2  Altered sleeping   2  Tired, decreased energy   1  Change in appetite   2  Feeling bad or failure about yourself    1  Trouble concentrating   0  Moving slowly or fidgety/restless   2  Suicidal thoughts   1  PHQ-9 Score   11   Difficult doing work/chores   Not difficult at all     Data saved with a previous flowsheet row definition     The following portions of the patient's history were reviewed and updated as appropriate: allergies, current medications, past family history, past medical history, past social history, past surgical history, and problem list.  Review of Systems  Constitutional: Negative.   Cardiovascular: Negative.   Gastrointestinal:  Negative for  abdominal pain, blood in stool, constipation, nausea and vomiting.  Genitourinary: Negative.  Negative for dysuria, flank pain, frequency, hematuria and urgency.  Endo/Heme/Allergies:  Does not bruise/bleed easily.  Psychiatric/Behavioral: Negative.  Negative for depression, memory loss and substance abuse. The patient is not nervous/anxious and does not have insomnia.     Past medical history, past surgical history, family history and social history were all reviewed and documented in the EPIC chart.  Exam:  Vitals:   02/25/24 0804  BP: 120/70  Pulse: 100  SpO2: 98%  Weight: 187 lb (84.8 kg)  Height: 5' 2.75 (1.594 m)   Body mass index is 33.39 kg/m.  Physical Exam Vitals and nursing note reviewed. Exam conducted with a chaperone present.  Constitutional:      Appearance: Normal appearance. [name] is normal weight.  HENT:     Head: Normocephalic and atraumatic.  Neck:     Thyroid : No thyroid  mass, thyromegaly or thyroid  tenderness.  Cardiovascular:     Rate and Rhythm: Regular rhythm.     Heart sounds: Normal heart sounds.  Pulmonary:     Effort: Pulmonary effort is normal.     Breath sounds: Normal breath sounds.  Chest:  Breasts:    Breasts are symmetrical.     Right: Normal. No  inverted nipple, mass, nipple discharge, skin change or tenderness.     Left: Normal. No inverted nipple, mass, nipple discharge, skin change or tenderness.  Abdominal:     General: Abdomen is flat. Bowel sounds are normal.     Palpations: Abdomen is soft.  Genitourinary:    General: Normal vulva.     Vagina: Normal. No vaginal discharge, bleeding or lesions.     Cervix: Normal. No discharge or lesion.     Uterus: Normal. Not enlarged and not tender.      Adnexa: Right adnexa normal and left adnexa normal.       Right: No mass, tenderness or fullness.         Left: No mass, tenderness or fullness.    Lymphadenopathy:     Upper Body:     Right upper body: No axillary adenopathy.      Left upper body: No axillary adenopathy.  Skin:    General: Skin is warm and dry.  Neurological:     Mental Status: Jireh is alert and oriented to person, place, and time.  Psychiatric:        Mood and Affect: Mood normal.        Thought Content: Thought content normal.        Judgment: Judgment normal.      Darice Hoit, CMA present for exam  Assessment/Plan:   1. Well woman exam with routine gynecological exam (Primary) Pap 2027  2. Screening for STDs (sexually transmitted diseases) - SURESWAB CT/NG/T. vaginalis - HIV Antibody (routine testing w rflx) - RPR W/RFLX TO RPR TITER, TREPONEMAL AB, SCREEN AND DIAGNOSIS - Hepatitis C antibody  3. Oral contraceptive pill surveillance - AVIANE  0.1-20 MG-MCG tablet; Take 1 tablet by mouth daily.  Dispense: 84 tablet; Refill: 4  4. Depression screening negative     Return in about 1 year (around 02/24/2025) for Annual.  GINETTE SHASTA NOVAK WHNP-BC 8:31 AM 02/25/2024 "

## 2024-02-26 LAB — HIV ANTIBODY (ROUTINE TESTING W REFLEX)
HIV 1&2 Ab, 4th Generation: NONREACTIVE
HIV FINAL INTERPRETATION: NEGATIVE

## 2024-02-26 LAB — HEPATITIS C ANTIBODY: Hepatitis C Ab: NONREACTIVE

## 2024-02-26 LAB — SYPHILIS: RPR W/REFLEX TO RPR TITER AND TREPONEMAL ANTIBODIES, TRADITIONAL SCREENING AND DIAGNOSIS ALGORITHM: RPR Ser Ql: NONREACTIVE

## 2024-02-27 ENCOUNTER — Ambulatory Visit: Payer: Self-pay | Admitting: Radiology

## 2024-02-27 LAB — SURESWAB CT/NG/T. VAGINALIS
C. trachomatis RNA, TMA: NOT DETECTED
N. gonorrhoeae RNA, TMA: NOT DETECTED
Trichomonas vaginalis RNA: NOT DETECTED

## 2024-03-12 ENCOUNTER — Other Ambulatory Visit: Payer: Self-pay | Admitting: Nurse Practitioner

## 2024-04-03 ENCOUNTER — Ambulatory Visit: Admitting: Radiology

## 2024-05-01 ENCOUNTER — Encounter: Admitting: Nurse Practitioner
